# Patient Record
Sex: Male | Born: 1965 | Race: Black or African American | Hispanic: No | Marital: Married | Smoking: Never smoker
Health system: Southern US, Community
[De-identification: ages and names within clinical notes are randomized; demographics above are authoritative.]

## PROBLEM LIST (undated history)

## (undated) DIAGNOSIS — I82409 Acute embolism and thrombosis of unspecified deep veins of unspecified lower extremity: Secondary | ICD-10-CM

## (undated) DIAGNOSIS — Z789 Other specified health status: Secondary | ICD-10-CM

## (undated) HISTORY — PX: NO PAST SURGERIES: SHX2092

---

## 2007-01-23 ENCOUNTER — Emergency Department (HOSPITAL_COMMUNITY): Admission: EM | Admit: 2007-01-23 | Discharge: 2007-01-23 | Payer: Self-pay | Admitting: Family Medicine

## 2007-01-27 ENCOUNTER — Emergency Department (HOSPITAL_COMMUNITY): Admission: EM | Admit: 2007-01-27 | Discharge: 2007-01-27 | Payer: Self-pay | Admitting: Emergency Medicine

## 2007-12-26 ENCOUNTER — Emergency Department (HOSPITAL_COMMUNITY): Admission: EM | Admit: 2007-12-26 | Discharge: 2007-12-26 | Payer: Self-pay | Admitting: Emergency Medicine

## 2009-11-15 ENCOUNTER — Encounter: Payer: Self-pay | Admitting: Physician Assistant

## 2009-12-13 ENCOUNTER — Ambulatory Visit: Payer: Self-pay | Admitting: Physician Assistant

## 2009-12-13 DIAGNOSIS — M25519 Pain in unspecified shoulder: Secondary | ICD-10-CM

## 2009-12-14 ENCOUNTER — Ambulatory Visit (HOSPITAL_COMMUNITY): Admission: RE | Admit: 2009-12-14 | Discharge: 2009-12-14 | Payer: Self-pay | Admitting: Physician Assistant

## 2009-12-17 ENCOUNTER — Telehealth: Payer: Self-pay | Admitting: Physician Assistant

## 2009-12-17 ENCOUNTER — Telehealth (INDEPENDENT_AMBULATORY_CARE_PROVIDER_SITE_OTHER): Payer: Self-pay | Admitting: *Deleted

## 2010-01-08 ENCOUNTER — Encounter: Admission: RE | Admit: 2010-01-08 | Discharge: 2010-02-13 | Payer: Self-pay | Admitting: Physician Assistant

## 2010-01-09 ENCOUNTER — Encounter: Payer: Self-pay | Admitting: Physician Assistant

## 2010-01-31 ENCOUNTER — Encounter: Payer: Self-pay | Admitting: Physician Assistant

## 2010-02-21 ENCOUNTER — Telehealth: Payer: Self-pay | Admitting: Physician Assistant

## 2010-02-21 ENCOUNTER — Ambulatory Visit: Payer: Self-pay | Admitting: Physician Assistant

## 2010-02-21 DIAGNOSIS — R82998 Other abnormal findings in urine: Secondary | ICD-10-CM | POA: Insufficient documentation

## 2010-02-21 DIAGNOSIS — H6120 Impacted cerumen, unspecified ear: Secondary | ICD-10-CM

## 2010-02-21 LAB — CONVERTED CEMR LAB
Alkaline Phosphatase: 69 units/L (ref 39–117)
Amphetamine Screen, Ur: NEGATIVE
Barbiturate Quant, Ur: NEGATIVE
Basophils Relative: 1 % (ref 0–1)
CO2: 23 meq/L (ref 19–32)
Calcium: 9.5 mg/dL (ref 8.4–10.5)
Chloride: 103 meq/L (ref 96–112)
Creatinine,U: 208 mg/dL
Glucose, Urine, Semiquant: NEGATIVE
Hemoglobin: 13.6 g/dL (ref 13.0–17.0)
MCHC: 31.6 g/dL (ref 30.0–36.0)
MCV: 81.5 fL (ref 78.0–100.0)
Methadone: NEGATIVE
Monocytes Absolute: 0.7 10*3/uL (ref 0.1–1.0)
Neutro Abs: 4.5 10*3/uL (ref 1.7–7.7)
PSA: 1.14 ng/mL (ref 0.10–4.00)
Phencyclidine (PCP): NEGATIVE
Platelets: 317 10*3/uL (ref 150–400)
Potassium: 4.6 meq/L (ref 3.5–5.3)
Protein, U semiquant: NEGATIVE
Sodium: 139 meq/L (ref 135–145)
Specific Gravity, Urine: >=1.03
TSH: 0.97 microintl units/mL (ref 0.350–4.500)
Total Bilirubin: 0.3 mg/dL (ref 0.3–1.2)
Urobilinogen, UA: 0.2
Vit D, 25-Hydroxy: 32 ng/mL (ref 30–89)
WBC Urine, dipstick: NEGATIVE

## 2010-02-22 ENCOUNTER — Encounter: Payer: Self-pay | Admitting: Physician Assistant

## 2010-02-26 ENCOUNTER — Ambulatory Visit: Payer: Self-pay | Admitting: Physician Assistant

## 2010-02-27 ENCOUNTER — Encounter: Payer: Self-pay | Admitting: Physician Assistant

## 2010-02-27 LAB — CONVERTED CEMR LAB
HDL: 52 mg/dL (ref >39)
LDL Cholesterol: 102 mg/dL — ABNORMAL HIGH (ref 0–99)
Total CHOL/HDL Ratio: 3.5
Triglycerides: 142 mg/dL (ref <150)
VLDL: 28 mg/dL (ref 0–40)

## 2010-10-15 NOTE — Letter (Signed)
Summary: *HSN Results Follow up  [facility]  [address] [place], [place] [address]   Phone: [phone]  Fax: [phone]      02/22/2010   [facility] [address] [place], [place]  [address]   Dear  Mr. Raymond Lindsey,                            ____S.Drinkard,FNP   ____D. Gore,FNP       ____B. McPherson,MD   ____V. Rankins,MD    ____E. Mulberry,MD    ____N. Daphine Deutscher, FNP  ____D. Reche Dixon, MD    ____K. Philipp Deputy, MD    __x__S. Alben Spittle, PA-C     This letter is to inform you that your recent test(s):  _______Pap Smear    ___x____Lab Test     _______X-ray    ___x____ is within acceptable limits  _______ requires a medication change  _______ requires a follow-up lab visit  _______ requires a follow-up visit with your provider   Comments: Liver function, kidney function, blood counts, prostate test, thyroid all looked normal.       _________________________________________________________ If you have any questions, please contact our office                     Sincerely,  Raymond Newcomer PA-C [facility]

## 2010-10-15 NOTE — Progress Notes (Signed)
Summary: TEST RESULTS  Phone Note Call from Patient Call back at Home Phone [phone]   Reason for Call: Lab or Test Results, Referral Summary of Call: Kristyanna Barcelo PT. MR Buddenhagen CALLED AND WOULD LIKE FOR SOMEONE TO CALL HIM WITH THE X-RAY RESULTS AND HE IS CHECKING ON HIS PT REFERRAL. Initial call taken by: Leodis Rains,  December 17, 2009 12:26 PM  Follow-up for Phone Call        forward to provider to review and provider signs off we will contact pt.. Follow-up by: Mikey College CMA,  December 17, 2009 5:01 PM  Additional Follow-up for Phone Call Additional follow up Details #1::        He has a bone spur which may irritate the rotator cuff.  Please check on his referral to PT. Additional Follow-up by: Tereso Newcomer PA-C,  December 17, 2009 5:37 PM    Additional Follow-up for Phone Call Additional follow up Details #2::    Left message on answering machine for pt to call back.Marland KitchenMarland KitchenMarland KitchenArmenia Shannon  December 17, 2009 6:07 PM  Left message on answering machine for pt to call back.Marland KitchenMarland KitchenMarland KitchenArmenia Shannon  December 18, 2009 10:06 AM  spoke with pt and he would like to know if there is something he needs to do while he waits on his PT referral.[facility]  December 18, 2009 3:05 PM   Additional Follow-up for Phone Call Additional follow up Details #3:: Details for Additional Follow-up Action Taken: He can take the naprosyn as needed. He can keep his arm moving (reaching over his head for range of motion) so his shoulder does not get stiff. 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  So, I expect PT will be helpful. If not, we may need to get an MRI and have him see ortho.  spoke with patient and he is aware of what he will need to do until PT referral is ready.Marland KitchenMarland KitchenMikey College CMA  December 21, 2009 10:20 AM  Additional Follow-up by: Tereso Newcomer PA-C,  December 19, 2009 9:33 PM

## 2010-10-15 NOTE — Miscellaneous (Signed)
Summary: Rehab Report/DISCHARGE SUMMARY  Rehab Report/DISCHARGE SUMMARY   Imported By: Arta Bruce 02/25/2010 09:15:56  _____________________________________________________________________  External Attachment:    Type:   Image     Comment:   External Document

## 2010-10-15 NOTE — Assessment & Plan Note (Signed)
Summary: PHSYSICAL EXAM//[facility]   Vital Signs:  Patient profile:   45 year old male Height:      66 inches Weight:      239 pounds BMI:     38.72 Temp:     97.4 degrees F oral Pulse rate:   77 / minute Pulse rhythm:   regular Resp:     18 per minute BP sitting:   122 / 78  (left arm) Cuff size:   large  Vitals Entered By: Armenia Shannon (February 21, 2010 2:23 PM) CC: cpe.... Is Patient Diabetic? No Pain Assessment Patient in pain? no       Does patient need assistance? Functional Status Self care Ambulation Normal   Primary Care Provider:  Tereso Newcomer PA-C  CC:  cpe.....  History of Present Illness: Here for CPE.  Health maint: Need lipids checked. PHQ9=0. No Fhx prostate cancer. No FHx of colon cancer. No urinary complaints. No complaints today.  Habits & Providers  Alcohol-Tobacco-Diet     Alcohol drinks/day: 0     Tobacco Status: quit  Exercise-Depression-Behavior     Have you felt down or hopeless? no     Have you felt little pleasure in things? no     STD Risk: never     Drug Use: never     Seat Belt Use: always     Sun Exposure: frequent     Sun Exposure Counseling: wear sunscreen  Problems Prior to Update: 1)  Cerumen Impaction, Bilateral  (ICD-380.4) 2)  Urinalysis, Abnormal  (ICD-791.9) 3)  Family Planning  (ICD-V25.09) 4)  Preventive Health Care  (ICD-V70.0) 5)  Shoulder Pain, Right  (ICD-719.41) 6)  Family History Diabetes 1st Degree Relative  (ICD-V18.0) 7)  Family History Breast Cancer 1st Degree Relative <50  (ICD-V16.3)  Allergies (verified): 1)  ! Pcn  Past History:  Past Medical History: Last updated: 12/13/2009 h/o DVT Left Leg   a.  2006; took coumadin for 6 mos.   b.  unknown cause per patient; not clear that he had hypercoagulable workup  Past Surgical History: Last updated: 12/13/2009 Denies surgical history  Family History: Reviewed history from 12/13/2009 and no changes required. no FHx of blood clots Family  History Breast cancer 1st degree relative <50 - mom Family History Diabetes 1st degree relative - dad Family History Hypertension - mom no FHx of colon or prostate cancer  Social History: Reviewed history from 12/13/2009 and no changes required. 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 CV:  Denies chest pain or discomfort and shortness of breath with exertion. Resp:  Denies cough. GI:  Denies bloody stools and dark tarry stools. GU:  Denies dysuria, hematuria, and nocturia. MS:  right shoulder pain much better after PT . Derm:  Denies rash. Psych:  Denies suicidal thoughts/plans. Heme:  Denies bleeding.  Physical Exam  General:  alert, well-developed, and well-nourished.   Head:  normocephalic and atraumatic.   Eyes:  pupils equal, pupils round, and pupils reactive to light.   Ears:  R cerumen impaction and L Cerumen impaction.   Nose:  no external deformity.   Mouth:  pharynx pink and moist and poor dentition.   Neck:  supple, no thyromegaly, and no  cervical lymphadenopathy.   Chest Wall:  no deformities.   Lungs:  normal breath sounds, no crackles, and no wheezes.   Heart:  normal rate, regular rhythm, and no murmur.   Abdomen:  soft, non-tender, and normal bowel sounds.   Rectal:  no external abnormalities, no hemorrhoids, normal sphincter tone, no masses, no tenderness, no fissures, no fistulae, and no perianal rash.   Genitalia:  circumcised, no hydrocele, no varicocele, no scrotal masses, no testicular masses or atrophy, no cutaneous lesions, and no urethral discharge.   Prostate:  no gland enlargement, no nodules, no asymmetry, and no induration.   Msk:  normal ROM.   Pulses:  R posterior tibial normal, R dorsalis pedis  normal, L posterior tibial normal, and L dorsalis pedis normal.   Extremities:  no edema Neurologic:  alert & oriented X3, cranial nerves II-XII intact, strength normal in all extremities, and DTRs symmetrical and normal.   Skin:  turgor normal.   Inguinal Nodes:  no R inguinal adenopathy and no L inguinal adenopathy.   Psych:  normally interactive and good eye contact.     Impression & Recommendations:  Problem # 1:  PREVENTIVE HEALTH CARE (ICD-V70.0) discussed PSA and he wants to get bring back for lipids  Orders: T-Comprehensive Metabolic Panel [phone]) T-CBC w/Diff [phone]) T-HIV Antibody  (Reflex) [phone]) T-TSH [id number]) T-Syphilis Test (RPR) [phone]) T-Urinalysis ([id number]) T-Drug Screen-Urine, (single) [phone]) T-Vitamin D (25-Hydroxy) ([id number]) T-PSA (60109-32355)  Problem # 2:  SHOULDER PAIN, RIGHT (ICD-719.41) Assessment: Improved f/u as needed  His updated medication list for this problem includes:    Naprosyn 500 Mg Tabs (Naproxen) .Marland Kitchen... Take 1 tablet by mouth two times a day for 5-7 days, then two times a day as needed for pain  Problem # 3:  FAMILY PLANNING (ICD-V25.09)  wants referral to urology to get vasectomy  Orders: Urology Referral (Urology)  Problem # 4:  CERUMEN IMPACTION, BILATERAL (ICD-380.4) ear flush today  Complete Medication List: 1)  Amoxicillin 500 Mg Caps (Amoxicillin) .... One cap three times a day 2)  Naprosyn 500 Mg Tabs (Naproxen) .... Take 1 tablet by mouth two times a day for 5-7 days, then two times a day as needed for pain  Other Orders: T- * Misc. Laboratory test [phone])  Patient Instructions: 1)  Schedule fasting Lipids (Dx V70.0). 2)  Bilateral ear flush in office today. 3)  Please schedule a follow-up appointment in 1-2 years with Lorin Picket for CPE or sooner as needed. 4)  Someone will call you about the referral to urology.   Laboratory Results   Urine Tests    Routine Urinalysis    Glucose: negative   (Normal Range: Negative) Bilirubin: negative   (Normal Range: Negative) Ketone: negative   (Normal Range: Negative) Spec. Gravity: >=1.030   (Normal Range: 1.003-1.035) Blood: trace-intact   (Normal Range: Negative) pH: 5.5   (Normal Range: 5.0-8.0) Protein: negative   (Normal Range: Negative) Urobilinogen: 0.2   (Normal Range: 0-1) Nitrite: negative   (Normal Range: Negative) Leukocyte Esterace: negative   (Normal Range: Negative)

## 2010-10-15 NOTE — Miscellaneous (Signed)
Summary: INITIAL SUMMARY  INITIAL SUMMARY   Imported By: Arta Bruce 01/14/2010 09:13:13  _____________________________________________________________________  External Attachment:    Type:   Image     Comment:   External Document

## 2010-10-15 NOTE — Letter (Signed)
Summary: PT INFORMATION SHEET  PT INFORMATION SHEET   Imported By: Arta Bruce 02/14/2010 15:43:16  _____________________________________________________________________  External Attachment:    Type:   Image     Comment:   External Document

## 2010-10-15 NOTE — Progress Notes (Signed)
Summary: Office Visit//DEPRESSION SCREENING  Office Visit//DEPRESSION SCREENING   Imported By: Arta Bruce 03/26/2010 12:38:18  _____________________________________________________________________  External Attachment:    Type:   Image     Comment:   External Document

## 2010-10-15 NOTE — Progress Notes (Signed)
  Phone Note Call from Patient   Summary of Call: pt has a referral in system for PT Initial call taken by: Armenia Shannon,  December 17, 2009 6:08 PM

## 2010-10-15 NOTE — Progress Notes (Signed)
Summary: Urology referral  Phone Note Outgoing Call   Summary of Call: Refer to urology for vasectomy. Initial call taken by: Tereso Newcomer PA-C,  February 21, 2010 3:10 PM  Follow-up for Phone Call        Pt is sched @ [facility] on 03-11-10 @ 2:30pm with Dr.Mullins.The address is [address] # is [phone] Pt is aware that this is a consult appt during which time he will sign a consent form and will have to wait 30 days for the procedure to be performed Follow-up by: Candi Leash,  February 27, 2010 3:12 PM

## 2010-10-15 NOTE — Letter (Signed)
Summary: *HSN Results Follow up  [facility]  [address] [place], [place] [address]   Phone: [phone]  Fax: [phone]      02/27/2010   [facility] [address] [place], [place]  [address]   Dear  Mr. Raymond Lindsey,                            ____S.Drinkard,FNP   ____D. Gore,FNP       ____B. McPherson,MD   ____V. Rankins,MD    ____E. Mulberry,MD    ____N. Daphine Deutscher, FNP  ____D. Reche Dixon, MD    ____K. Philipp Deputy, MD    __x__S. Alben Spittle, PA-C     This letter is to inform you that your recent test(s):  _______Pap Smear    ___x____Lab Test     _______X-ray    ___x____ is within acceptable limits  _______ requires a medication change  _______ requires a follow-up lab visit  _______ requires a follow-up visit with your provider   Comments:  Cholesterol numbers are good.       _________________________________________________________ If you have any questions, please contact our office                     Sincerely,  Raymond Newcomer PA-C [facility]

## 2010-10-15 NOTE — Assessment & Plan Note (Signed)
Summary: NEW MEDICAID PT/ FIRST EST CARE/ [facility]   Vital Signs:  Patient profile:   45 year old male Height:      66 inches Weight:      244 pounds BMI:     39.52 Temp:     97.9 degrees F oral Pulse rate:   68 / minute Pulse rhythm:   regular Resp:     18 per minute BP sitting:   135 / 75  (left arm) Cuff size:   large  Vitals Entered By: Arm[place] Shannon (December 13, 2009 10:45 AM) CC: wants to get CPE..;. pt is concerned about his right shoulder pain for a couple of months... Is Patient Diabetic? No Pain Assessment Patient in pain? yes     Location: right shoulder Intensity: 7 Type: sharp Onset of pain  Gradual  Does patient need assistance? Functional Status Self care Ambulation Normal   Primary Care Provider:  Tereso Newcomer PA-C  CC:  wants to get CPE..;. pt is concerned about his right shoulder pain for a couple of months....  History of Present Illness: New patient. No regular healthcare.  Used to go to urgent care. Had root canal recently and is on amox. now.  R shoulder pain:  Pain x 1 month.  Pain on outside over deltoid.  Comes and goes.  Notes with outstretched arm over head.  Notes pain at about 90 degrees of abduction.  NO bruising or swelling.  No h/o injury or surgery.  Takes tylenol at times with some relief.  Seems to be worse early in morning.  Feels with certain movements during the day.  Habits & Providers  Alcohol-Tobacco-Diet     Tobacco Status: never  Exercise-Depression-Behavior     Drug Use: no  Current Medications (verified): 1)  Amoxicillin 500 Mg Caps (Amoxicillin) .... One Cap Three Times A Day  Allergies (verified): 1)  ! Pcn  Past History:  Past Medical History: h/o DVT Left Leg   a.  2006; took coumadin for 6 mos.   b.  unknown cause per patient; not clear that he had hypercoagulable workup  Past Surgical History: Denies surgical history  Family History: no FHx of blood clots Family History Breast cancer 1st degree relative  <50 - mom Family History Diabetes 1st degree relative - dad Family History Hypertension - mom no FHx of colon or prostate cancer  Social History: Occupation: unemployed (Worked for [employer]) Married 2 kids Never Smoked Alcohol use-no Drug use-no Occupation:  employed Smoking Status:  never Drug Use:  no  Review of Systems      See HPI General:  Denies chills and fever. CV:  Denies chest pain or discomfort. Resp:  Denies cough. GI:  Denies bloody stools. GU:  Denies dysuria.  Physical Exam  General:  alert, well-developed, and well-nourished.   Head:  normocephalic and atraumatic.   Neck:  supple.   Lungs:  normal breath sounds.   Heart:  normal rate and regular rhythm.   Msk:  right shoulder: limited internal rotation due to pain + pain with empty can test no deformity + pain over AC joint and suprspinatus insertion site no pain over biceps tendon no coracoclavicular pain  Extremities:  no edema  Neurologic:  alert & oriented X3 and cranial nerves II-XII intact.   Psych:  normally interactive.     Impression & Recommendations:  Problem # 1:  SHOULDER PAIN, RIGHT (ICD-719.41)  suspect impingement  Rx for naproxen get xray send to PT  f/u at CPE  His updated medication list for this problem includes:    Naprosyn 500 Mg Tabs (Naproxen) .Marland Kitchen... Take 1 tablet by mouth two times a day for 5-7 days, then two times a day as needed for pain  Orders: Diagnostic X-Ray/Fluoroscopy (Diagnostic X-Ray/Flu) Physical Therapy Referral (PT)  Problem # 2:  Preventive Health Care (ICD-V70.0) schedule CPE  Complete Medication List: 1)  Amoxicillin 500 Mg Caps (Amoxicillin) .... One cap three times a day 2)  Naprosyn 500 Mg Tabs (Naproxen) .... 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 2)  Please schedule a follow-up appointment in 2 months for CPE. 3)   Come in to next appt fasting.  Do not eat or drink after midnight except water. 4)  Take naprosyn two times a day for 5-7 days, then take two times a day as needed for pain. 5)  Someone will call you about your appt with physical therapy. Prescriptions: NAPROSYN 500 MG TABS (NAPROXEN) Take 1 tablet by mouth two times a day for 5-7 days, then two times a day as needed for pain  #60 x 1   Entered and Authorized by:   Tereso Newcomer PA-C   Signed by:   Tereso Newcomer PA-C on 12/13/2009   Method used:   Print then Give to Patient   RxID:   [id number]

## 2012-02-12 ENCOUNTER — Encounter (HOSPITAL_BASED_OUTPATIENT_CLINIC_OR_DEPARTMENT_OTHER): Payer: Self-pay | Admitting: *Deleted

## 2012-02-12 NOTE — Progress Notes (Signed)
No labs needed Rare use of albuterol inhaler

## 2012-02-16 ENCOUNTER — Ambulatory Visit (HOSPITAL_BASED_OUTPATIENT_CLINIC_OR_DEPARTMENT_OTHER)
Admission: RE | Admit: 2012-02-16 | Discharge: 2012-02-16 | Disposition: A | Discharge status: Home / Self Care | Payer: Medicaid Other | Source: Ambulatory Visit | Attending: Specialist | Admitting: Specialist

## 2012-02-16 ENCOUNTER — Encounter (HOSPITAL_BASED_OUTPATIENT_CLINIC_OR_DEPARTMENT_OTHER)
Admission: RE | Disposition: A | Discharge status: Home / Self Care | Payer: Self-pay | Source: Ambulatory Visit | Attending: Specialist

## 2012-02-16 ENCOUNTER — Encounter (HOSPITAL_BASED_OUTPATIENT_CLINIC_OR_DEPARTMENT_OTHER): Payer: Self-pay

## 2012-02-16 ENCOUNTER — Encounter (HOSPITAL_BASED_OUTPATIENT_CLINIC_OR_DEPARTMENT_OTHER): Payer: Self-pay | Admitting: Anesthesiology

## 2012-02-16 ENCOUNTER — Ambulatory Visit (HOSPITAL_BASED_OUTPATIENT_CLINIC_OR_DEPARTMENT_OTHER): Payer: Medicaid Other | Admitting: Anesthesiology

## 2012-02-16 DIAGNOSIS — D1739 Benign lipomatous neoplasm of skin and subcutaneous tissue of other sites: Secondary | ICD-10-CM | POA: Insufficient documentation

## 2012-02-16 DIAGNOSIS — J45909 Unspecified asthma, uncomplicated: Secondary | ICD-10-CM | POA: Insufficient documentation

## 2012-02-16 HISTORY — DX: Other specified health status: Z78.9

## 2012-02-16 HISTORY — PX: MASS EXCISION: SHX2000

## 2012-02-16 SURGERY — EXCISION MASS
Anesthesia: General | Site: Neck | Laterality: Left | Wound class: Clean

## 2012-02-16 MED ORDER — PROPOFOL 10 MG/ML IV EMUL
INTRAVENOUS | Status: DC | PRN
  Administered 2012-02-16: 200 mg via INTRAVENOUS

## 2012-02-16 MED ORDER — ONDANSETRON HCL 4 MG/2ML IJ SOLN
INTRAMUSCULAR | Status: DC | PRN
  Administered 2012-02-16: 4 mg via INTRAVENOUS

## 2012-02-16 MED ORDER — CEFAZOLIN SODIUM 1-5 GM-% IV SOLN
1.0000 g | INTRAVENOUS | Status: AC
  Administered 2012-02-16: 2 g via INTRAVENOUS

## 2012-02-16 MED ORDER — MIDAZOLAM HCL 5 MG/5ML IJ SOLN
INTRAMUSCULAR | Status: DC | PRN
  Administered 2012-02-16: 2 mg via INTRAVENOUS

## 2012-02-16 MED ORDER — LACTATED RINGERS IV SOLN
INTRAVENOUS | Status: DC
  Administered 2012-02-16 (×2): via INTRAVENOUS

## 2012-02-16 MED ORDER — LIDOCAINE-EPINEPHRINE 0.5-1:200000 % IJ SOLN
INTRAMUSCULAR | Status: DC | PRN
  Administered 2012-02-16: 30 mL

## 2012-02-16 MED ORDER — FENTANYL CITRATE 0.05 MG/ML IJ SOLN
INTRAMUSCULAR | Status: DC | PRN
  Administered 2012-02-16 (×2): 50 ug via INTRAVENOUS

## 2012-02-16 MED ORDER — LIDOCAINE HCL (CARDIAC) 20 MG/ML IV SOLN
INTRAVENOUS | Status: DC | PRN
  Administered 2012-02-16: 60 mg via INTRAVENOUS

## 2012-02-16 MED ORDER — IPRATROPIUM-ALBUTEROL 18-103 MCG/ACT IN AERO
INHALATION_SPRAY | RESPIRATORY_TRACT | Status: DC | PRN
  Administered 2012-02-16: 2 via RESPIRATORY_TRACT

## 2012-02-16 MED ORDER — DEXAMETHASONE SODIUM PHOSPHATE 4 MG/ML IJ SOLN
INTRAMUSCULAR | Status: DC | PRN
  Administered 2012-02-16: 10 mg via INTRAVENOUS

## 2012-02-16 SURGICAL SUPPLY — 60 items
BAG DECANTER FOR FLEXI CONT (MISCELLANEOUS) ×2 IMPLANT
BANDAGE ELASTIC 4 VELCRO ST LF (GAUZE/BANDAGES/DRESSINGS) IMPLANT
BANDAGE GAUZE ELAST BULKY 4 IN (GAUZE/BANDAGES/DRESSINGS) IMPLANT
BENZOIN TINCTURE PRP APPL 2/3 (GAUZE/BANDAGES/DRESSINGS) IMPLANT
BLADE KNIFE PERSONA 10 (BLADE) ×2 IMPLANT
BLADE KNIFE PERSONA 15 (BLADE) ×2 IMPLANT
BNDG COHESIVE 4X5 TAN STRL (GAUZE/BANDAGES/DRESSINGS) IMPLANT
CANISTER SUCTION 1200CC (MISCELLANEOUS) IMPLANT
CLEANER CAUTERY TIP 5X5 PAD (MISCELLANEOUS) IMPLANT
CLOTH BEACON ORANGE TIMEOUT ST (SAFETY) ×2 IMPLANT
COTTONBALL LRG STERILE PKG (GAUZE/BANDAGES/DRESSINGS) IMPLANT
COVER MAYO STAND STRL (DRAPES) ×2 IMPLANT
COVER TABLE BACK 60X90 (DRAPES) ×2 IMPLANT
DRAPE LAPAROSCOPIC ABDOMINAL (DRAPES) IMPLANT
DRAPE PED LAPAROTOMY (DRAPES) IMPLANT
DRAPE U-SHAPE 76X120 STRL (DRAPES) ×2 IMPLANT
DRSG PAD ABDOMINAL 8X10 ST (GAUZE/BANDAGES/DRESSINGS) IMPLANT
ELECT NEEDLE TIP 2.8 STRL (NEEDLE) ×2 IMPLANT
ELECT REM PT RETURN 9FT ADLT (ELECTROSURGICAL) ×2
ELECTRODE REM PT RTRN 9FT ADLT (ELECTROSURGICAL) ×1 IMPLANT
FILTER 7/8 IN (FILTER) IMPLANT
GAUZE SPONGE 4X4 12PLY STRL LF (GAUZE/BANDAGES/DRESSINGS) IMPLANT
GAUZE SPONGE 4X4 16PLY XRAY LF (GAUZE/BANDAGES/DRESSINGS) IMPLANT
GAUZE XEROFORM 1X8 LF (GAUZE/BANDAGES/DRESSINGS) ×2 IMPLANT
GAUZE XEROFORM 5X9 LF (GAUZE/BANDAGES/DRESSINGS) IMPLANT
GLOVE BIO SURGEON STRL SZ 6.5 (GLOVE) ×4 IMPLANT
GLOVE BIOGEL M STRL SZ7.5 (GLOVE) ×2 IMPLANT
GLOVE BIOGEL PI IND STRL 8 (GLOVE) ×1 IMPLANT
GLOVE BIOGEL PI INDICATOR 8 (GLOVE) ×1
GLOVE ECLIPSE 7.0 STRL STRAW (GLOVE) ×2 IMPLANT
GOWN PREVENTION PLUS XLARGE (GOWN DISPOSABLE) IMPLANT
GOWN PREVENTION PLUS XXLARGE (GOWN DISPOSABLE) ×2 IMPLANT
NEEDLE HYPO 25X1 1.5 SAFETY (NEEDLE) ×2 IMPLANT
PACK BASIN DAY SURGERY FS (CUSTOM PROCEDURE TRAY) ×2 IMPLANT
PAD CLEANER CAUTERY TIP 5X5 (MISCELLANEOUS)
PENCIL BUTTON HOLSTER BLD 10FT (ELECTRODE) ×2 IMPLANT
SHEET MEDIUM DRAPE 40X70 STRL (DRAPES) ×2 IMPLANT
SHEETING SILICONE GEL EPI DERM (MISCELLANEOUS) IMPLANT
SPONGE GAUZE 4X4 12PLY (GAUZE/BANDAGES/DRESSINGS) IMPLANT
SPONGE LAP 18X18 X RAY DECT (DISPOSABLE) ×2 IMPLANT
STAPLER VISISTAT 35W (STAPLE) IMPLANT
STOCKINETTE 4X48 STRL (DRAPES) IMPLANT
STRIP CLOSURE SKIN 1/2X4 (GAUZE/BANDAGES/DRESSINGS) IMPLANT
STRIP SUTURE WOUND CLOSURE 1/2 (SUTURE) IMPLANT
SUCTION FRAZIER TIP 10 FR DISP (SUCTIONS) IMPLANT
SUT MNCRL AB 3-0 PS2 18 (SUTURE) IMPLANT
SUT PROLENE 4 0 P 3 18 (SUTURE) IMPLANT
SUT PROLENE 4 0 PS 2 18 (SUTURE) ×2 IMPLANT
SUT SILK 3 0 PS 1 (SUTURE) IMPLANT
SYR 20CC LL (SYRINGE) ×4 IMPLANT
SYR CONTROL 10ML LL (SYRINGE) ×2 IMPLANT
TAPE HYPAFIX 6X30 (GAUZE/BANDAGES/DRESSINGS) IMPLANT
TOWEL OR 17X24 6PK STRL BLUE (TOWEL DISPOSABLE) ×4 IMPLANT
TRAY DSU PREP LF (CUSTOM PROCEDURE TRAY) ×2 IMPLANT
TUBE CONNECTING 20X1/4 (TUBING) IMPLANT
TUBING SET GRADUATE ASPIR 12FT (MISCELLANEOUS) ×2 IMPLANT
UNDERPAD 30X30 INCONTINENT (UNDERPADS AND DIAPERS) ×2 IMPLANT
VAC PENCILS W/TUBING CLEAR (MISCELLANEOUS) ×2 IMPLANT
WATER STERILE IRR 1000ML POUR (IV SOLUTION) ×2 IMPLANT
YANKAUER SUCT BULB TIP NO VENT (SUCTIONS) ×2 IMPLANT

## 2012-02-16 NOTE — H&P (Signed)
Pascual Greenley is an 46 y.o. male.   Chief Complaint: Mass left back HPI: Increased growth and pain  Past Medical History  Diagnosis Date  . No pertinent past medical history   . Asthma     mild-mostly exercise induced    Past Surgical History  Procedure Date  . No past surgeries     never been sedated    History reviewed. No pertinent family history. Social History:  reports that he has never smoked. He does not have any smokeless tobacco history on file. He reports that he does not drink alcohol or use illicit drugs.  Allergies:  Allergies  Allergen Reactions  . Penicillins     Medications Prior to Admission  Medication Sig Dispense Refill  . albuterol (PROVENTIL HFA;VENTOLIN HFA) 108 (90 BASE) MCG/ACT inhaler Inhale 2 puffs into the lungs every 6 (six) hours as needed.      . Multiple Vitamins-Minerals (MULTIVITAMIN WITH MINERALS) tablet Take 1 tablet by mouth daily.        No results found for this or any previous visit (from the past 48 hour(s)). No results found.  Review of Systems  Constitutional: Negative.   HENT: Negative.   Eyes: Negative.   Cardiovascular: Negative.   Gastrointestinal: Negative.   Genitourinary: Negative.   Musculoskeletal: Negative.   Skin: Negative.   Neurological: Negative.   Endo/Heme/Allergies: Negative.   Psychiatric/Behavioral: Negative.     Blood pressure 132/79, pulse 75, temperature 98.6 F (37 C), temperature source Oral, resp. rate 20, height 5\' 10"  (1.778 m), weight 99.791 kg (220 lb), SpO2 100.00%. Physical Exam   Assessment/Plan Mass left back with excision and plastic closure  Fatim Vanderschaaf L 02/16/2012, 7:13 AM

## 2012-02-16 NOTE — Brief Op Note (Signed)
02/16/2012  8:10 AM  PATIENT:  Raymond Lindsey  46 y.o. male  PRE-OPERATIVE DIAGNOSIS:  mass left neck  POST-OPERATIVE DIAGNOSIS:  mass left neck  PROCEDURE:  Procedure(s) (LRB): EXCISION MASS (Left) and plastic closure  SURGEON:  Surgeon(s) and Role:    * Louisa Second, MD - Primary  PHYSICIAN ASSISTANT:   ASSISTANTS: none   ANESTHESIA:   general  EBL:     BLOOD ADMINISTERED:none  DRAINS: none   LOCAL MEDICATIONS USED:  LIDOCAINE   SPECIMEN:  Excision  DISPOSITION OF SPECIMEN:  PATHOLOGY  COUNTS:  YES  TOURNIQUET:  * No tourniquets in log *  DICTATION: .Other Dictation: Dictation Number [id number]  PLAN OF CARE: Discharge to home after PACU  PATIENT DISPOSITION:  PACU - hemodynamically stable.   Delay start of Pharmacological VTE agent (>24hrs) due to surgical blood loss or risk of bleeding: not applicable

## 2012-02-16 NOTE — Anesthesia Preprocedure Evaluation (Addendum)
Anesthesia Evaluation  Patient identified by MRN, date of birth, ID band Patient awake    Reviewed: Allergy & Precautions, H&P , NPO status , Patient's Chart, lab work & pertinent test results  Airway Mallampati: I TM Distance: >3 FB Neck ROM: Full    Dental  (+) Partial Lower and Partial Upper   Pulmonary  breath sounds clear to auscultation        Cardiovascular Rhythm:Regular Rate:Normal     Neuro/Psych    GI/Hepatic   Endo/Other    Renal/GU      Musculoskeletal   Abdominal   Peds  Hematology   Anesthesia Other Findings   Reproductive/Obstetrics                          Anesthesia Physical Anesthesia Plan  ASA: II  Anesthesia Plan: General   Post-op Pain Management:    Induction: Intravenous  Airway Management Planned: Oral ETT  Additional Equipment:   Intra-op Plan:   Post-operative Plan: Extubation in OR  Informed Consent: I have reviewed the patients History and Physical, chart, labs and discussed the procedure including the risks, benefits and alternatives for the proposed anesthesia with the patient or authorized representative who has indicated his/her understanding and acceptance.   Dental advisory given  Plan Discussed with:   Anesthesia Plan Comments: (Mild asthma Left lateral neck mass  Plan GA with oral ETT  Kipp Brood, MD)        Anesthesia Quick Evaluation

## 2012-02-16 NOTE — Anesthesia Postprocedure Evaluation (Signed)
  Anesthesia Post-op Note  Patient: Raymond Lindsey  Procedure(s) Performed: Procedure(s) (LRB): EXCISION MASS (Left)  Patient Location: PACU  Anesthesia Type: General  Level of Consciousness: awake, alert  and oriented  Airway and Oxygen Therapy: Patient Spontanous Breathing  Post-op Pain: mild  Post-op Assessment: Post-op Vital signs reviewed and Patient's Cardiovascular Status Stable  Post-op Vital Signs: stable  Complications: No apparent anesthesia complications

## 2012-02-16 NOTE — Anesthesia Procedure Notes (Signed)
Procedure Name: Intubation Performed by: York Grice Pre-anesthesia Checklist: Patient identified, Emergency Drugs available, Suction available, Patient being monitored and Timeout performed Patient Re-evaluated:Patient Re-evaluated prior to inductionOxygen Delivery Method: Circle system utilized Preoxygenation: Pre-oxygenation with 100% oxygen Intubation Type: IV induction Ventilation: Mask ventilation without difficulty Laryngoscope Size: Miller and 2 Grade View: Grade I Tube type: Oral Tube size: 8.0 mm Number of attempts: 1 Airway Equipment and Method: Stylet Placement Confirmation: ETT inserted through vocal cords under direct vision,  breath sounds checked- equal and bilateral and positive ETCO2 Secured at: 23 cm Tube secured with: Tape Dental Injury: Teeth and Oropharynx as per pre-operative assessment

## 2012-02-16 NOTE — Discharge Instructions (Signed)
Keep dressings clean and dry May remove after 48 hrs  Take meds as prescribed   Post Anesthesia Home Care Instructions  Activity: Get plenty of rest for the remainder of the day. A responsible adult should stay with you for 24 hours following the procedure.  For the next 24 hours, DO NOT: -Drive a car -Advertising copywriter -Drink alcoholic beverages -Take any medication unless instructed by your physician -Make any legal decisions or sign important papers.  Meals: Start with liquid foods such as gelatin or soup. Progress to regular foods as tolerated. Avoid greasy, spicy, heavy foods. If nausea and/or vomiting occur, drink only clear liquids until the nausea and/or vomiting subsides. Call your physician if vomiting continues.  Special Instructions/Symptoms: Your throat may feel dry or sore from the anesthesia or the breathing tube placed in your throat during surgery. If this causes discomfort, gargle with warm salt water. The discomfort should disappear within 24 hours.

## 2012-02-16 NOTE — Transfer of Care (Signed)
Immediate Anesthesia Transfer of Care Note  Patient: Raymond Lindsey  Procedure(s) Performed: Procedure(s) (LRB): EXCISION MASS (Left)  Patient Location: PACU  Anesthesia Type: General  Level of Consciousness: awake, alert  and oriented  Airway & Oxygen Therapy: Patient Spontanous Breathing and Patient connected to face mask oxygen  Post-op Assessment: Report given to PACU RN and Post -op Vital signs reviewed and stable  Post vital signs: Reviewed and stable  Complications: No apparent anesthesia complications

## 2012-02-17 NOTE — Op Note (Signed)
Raymond Lindsey, Raymond Lindsey               ACCOUNT NO.:  [id number]  MEDICAL RECORD NO.:  [id number]  LOCATION:                                 FACILITY:  PHYSICIAN:  Earvin Hansen L. Shon Hough, M.D.   DATE OF BIRTH:  DATE OF PROCEDURE:  02/16/2012 DATE OF DISCHARGE:                              OPERATIVE REPORT   This is a 46 year old gentleman who has enlarging mass involving his left posterior neck and back area junction, increased pain and discomfort, rule out lipoma, rule out liposarcoma.  ANESTHESIA:  General.  DESCRIPTION OF PROCEDURE:  The patient preoperatively underwent drawings for the area.  The area measured approximately 6 x 8 to 10 cm in length and width.  He underwent general anesthesia, intubated orally.  Prep was done to the left neck and back areas with Betadine soap and solution, walled off with sterile towels and drapes so as to make a sterile field. Xylocaine 0.25% with epinephrine was injected locally, total of 50 mL. This was allowed to set up and I was able to excise the mass, 15 blade, dissection with Metzenbaum scissors.  The residual portions peripherally were liposuctioned to smooth out the contour.  The mass transcended underneath the fascia which was open.  Hemostasis was maintained with the Bovie anticoagulation.  After proper hemostasis, the wounds were then closed in layers with advancement flap of 3-0 Monocryl x2 layers, a running subcuticular stitch of 3-0 Monocryl.  Steri-Strips and soft dressing were applied to all the areas.  The mass had the appearance of a lipoma, to be sent to Pathology for examination.  Sterile dressing were applied and Hypafix tape.  He withstood the procedures very well. He was taken to recovery in excellent condition.     Raymond Lindsey. Shon Hough, M.D.     Cathie Hoops  D:  02/16/2012  T:  02/16/2012  Job:  [id number]

## 2012-02-19 ENCOUNTER — Encounter (HOSPITAL_BASED_OUTPATIENT_CLINIC_OR_DEPARTMENT_OTHER): Payer: Self-pay | Admitting: Specialist

## 2013-10-21 ENCOUNTER — Emergency Department (HOSPITAL_COMMUNITY)
Admission: EM | Admit: 2013-10-21 | Discharge: 2013-10-21 | Disposition: A | Discharge status: Home / Self Care | Payer: BC Managed Care – PPO | Attending: Emergency Medicine | Admitting: Emergency Medicine

## 2013-10-21 ENCOUNTER — Encounter (HOSPITAL_COMMUNITY): Payer: Self-pay | Admitting: Emergency Medicine

## 2013-10-21 ENCOUNTER — Emergency Department (HOSPITAL_COMMUNITY): Payer: BC Managed Care – PPO

## 2013-10-21 DIAGNOSIS — R1110 Vomiting, unspecified: Secondary | ICD-10-CM

## 2013-10-21 DIAGNOSIS — Z880 Allergy status to penicillin: Secondary | ICD-10-CM | POA: Insufficient documentation

## 2013-10-21 DIAGNOSIS — Z79899 Other long term (current) drug therapy: Secondary | ICD-10-CM | POA: Insufficient documentation

## 2013-10-21 DIAGNOSIS — R12 Heartburn: Secondary | ICD-10-CM | POA: Insufficient documentation

## 2013-10-21 DIAGNOSIS — R55 Syncope and collapse: Secondary | ICD-10-CM | POA: Insufficient documentation

## 2013-10-21 DIAGNOSIS — R079 Chest pain, unspecified: Secondary | ICD-10-CM | POA: Insufficient documentation

## 2013-10-21 DIAGNOSIS — J45909 Unspecified asthma, uncomplicated: Secondary | ICD-10-CM | POA: Insufficient documentation

## 2013-10-21 DIAGNOSIS — R61 Generalized hyperhidrosis: Secondary | ICD-10-CM | POA: Insufficient documentation

## 2013-10-21 DIAGNOSIS — R112 Nausea with vomiting, unspecified: Secondary | ICD-10-CM | POA: Insufficient documentation

## 2013-10-21 LAB — COMPREHENSIVE METABOLIC PANEL
ALT: 19 U/L (ref 0–53)
AST: 21 U/L (ref 0–37)
Albumin: 3.7 g/dL (ref 3.5–5.2)
Alkaline Phosphatase: 69 U/L (ref 39–117)
BILIRUBIN TOTAL: 0.4 mg/dL (ref 0.3–1.2)
BUN: 13 mg/dL (ref 6–23)
CO2: 24 meq/L (ref 19–32)
Calcium: 8.6 mg/dL (ref 8.4–10.5)
Chloride: 107 mEq/L (ref 96–112)
Creatinine, Ser: 0.95 mg/dL (ref 0.50–1.35)
GFR calc non Af Amer: >90 mL/min (ref >90)
Glucose, Bld: 102 mg/dL — ABNORMAL HIGH (ref 70–99)
POTASSIUM: 4.3 meq/L (ref 3.7–5.3)
SODIUM: 142 meq/L (ref 137–147)
TOTAL PROTEIN: 7.4 g/dL (ref 6.0–8.3)

## 2013-10-21 LAB — CBC WITH DIFFERENTIAL/PLATELET
Basophils Absolute: 0 10*3/uL (ref 0.0–0.1)
Basophils Relative: 0 % (ref 0–1)
EOS ABS: 0 10*3/uL (ref 0.0–0.7)
Eosinophils Relative: 0 % (ref 0–5)
HCT: 42.9 % (ref 39.0–52.0)
HEMOGLOBIN: 14 g/dL (ref 13.0–17.0)
LYMPHS ABS: 0.4 10*3/uL — AB (ref 0.7–4.0)
LYMPHS PCT: 5 % — AB (ref 12–46)
MCH: 26 pg (ref 26.0–34.0)
MCHC: 32.6 g/dL (ref 30.0–36.0)
MCV: 79.7 fL (ref 78.0–100.0)
MONOS PCT: 4 % (ref 3–12)
Monocytes Absolute: 0.4 10*3/uL (ref 0.1–1.0)
NEUTROS ABS: 7.3 10*3/uL (ref 1.7–7.7)
Neutrophils Relative %: 90 % — ABNORMAL HIGH (ref 43–77)
PLATELETS: 253 10*3/uL (ref 150–400)
RBC: 5.38 MIL/uL (ref 4.22–5.81)
RDW: 13.2 % (ref 11.5–15.5)
WBC: 8.2 10*3/uL (ref 4.0–10.5)

## 2013-10-21 LAB — POCT I-STAT TROPONIN I
TROPONIN I, POC: 0.01 ng/mL (ref 0.00–0.08)
Troponin i, poc: 0 ng/mL (ref 0.00–0.08)

## 2013-10-21 LAB — LIPASE, BLOOD: LIPASE: 24 U/L (ref 11–59)

## 2013-10-21 MED ORDER — SODIUM CHLORIDE 0.9 % IV BOLUS (SEPSIS)
1000.0000 mL | Freq: Once | INTRAVENOUS | Status: AC
  Administered 2013-10-21: 1000 mL via INTRAVENOUS

## 2013-10-21 MED ORDER — ONDANSETRON HCL 4 MG/2ML IJ SOLN
4.0000 mg | Freq: Once | INTRAMUSCULAR | Status: AC
  Administered 2013-10-21: 4 mg via INTRAVENOUS
  Filled 2013-10-21: qty 2

## 2013-10-21 MED ORDER — OMEPRAZOLE 20 MG PO CPDR: 20.0000 mg | DELAYED_RELEASE_CAPSULE | Freq: Every day | ORAL | Status: AC

## 2013-10-21 MED ORDER — FAMOTIDINE IN NACL 20-0.9 MG/50ML-% IV SOLN
20.0000 mg | Freq: Once | INTRAVENOUS | Status: AC
  Administered 2013-10-21: 20 mg via INTRAVENOUS
  Filled 2013-10-21: qty 50

## 2013-10-21 MED ORDER — GI COCKTAIL ~~LOC~~
30.0000 mL | Freq: Once | ORAL | Status: AC
  Administered 2013-10-21: 30 mL via ORAL
  Filled 2013-10-21: qty 30

## 2013-10-21 NOTE — ED Provider Notes (Signed)
CSN: [id number]     Arrival date & time 10/21/13  0543 History   First MD Initiated Contact with Patient 10/21/13 0600     Chief Complaint  Patient presents with  . Chest Pain  . Loss of Consciousness   (Consider location/radiation/quality/duration/timing/severity/associated sxs/prior Treatment) HPI Comments: Patient presents to the ED with a chief complaint of a "burning sensation" in his chest.  He states that the symptoms started this morning around 0230.  He endorses associated nausea, vomiting, and diaphoresis.  He states that he was vomiting for about 2 hours, and states that he passed out while vomiting.  Wife called EMS.  Patient was given 324 of aspirin.  Patient states that he has never experienced anything like this before.  He denies any radiating symptoms.  No cardiac risk factors.  Patient does state that he has been taking a new workout supplement, but is uncertain as to what it is.  He also states that he had [place] sausage and onions last night.  Currently he is chest pain free, but does endorse heartburn.  The history is provided by the patient. No language interpreter was used.    Past Medical History  Diagnosis Date  . No pertinent past medical history   . Asthma     mild-mostly exercise induced   Past Surgical History  Procedure Laterality Date  . No past surgeries      never been sedated  . Mass excision  02/16/2012    Procedure: EXCISION MASS;  Surgeon: Cristine Polio, MD;  Location: [facility];  Service: Plastics;  Laterality: Left;  left neck   No family history on file. History  Substance Use Topics  . Smoking status: Never Smoker   . Smokeless tobacco: Not on file  . Alcohol Use: Yes    Review of Systems  All other systems reviewed and are negative.    Allergies  Penicillins  Home Medications   Current Outpatient Rx  Name  Route  Sig  Dispense  Refill  . albuterol (PROVENTIL HFA;VENTOLIN HFA) 108 (90 BASE) MCG/ACT inhaler  Inhalation   Inhale 2 puffs into the lungs every 6 (six) hours as needed.          BP 116/63  Pulse 89  Temp(Src) 98.7 F (37.1 C) (Oral)  Resp 15  Ht 5\' 10"  (1.778 m)  Wt 220 lb (99.791 kg)  BMI 31.57 kg/m2  SpO2 96% Physical Exam  Nursing note and vitals reviewed. Constitutional: He is oriented to person, place, and time. He appears well-developed and well-nourished.  HENT:  Head: Normocephalic and atraumatic.  Eyes: Conjunctivae and EOM are normal. Pupils are equal, round, and reactive to light. Right eye exhibits no discharge. Left eye exhibits no discharge. No scleral icterus.  Neck: Normal range of motion. Neck supple. No JVD present.  Cardiovascular: Normal rate, regular rhythm and normal heart sounds.  Exam reveals no gallop and no friction rub.   No murmur heard. Pulmonary/Chest: Effort normal and breath sounds normal. No respiratory distress. He has no wheezes. He has no rales. He exhibits no tenderness.  CTAB  Abdominal: Soft. He exhibits no distension and no mass. There is no tenderness. There is no rebound and no guarding.  No focal abdominal tenderness, no RLQ tenderness or pain at McBurney's point, no RUQ tenderness or Murphy's sign, no left-sided abdominal tenderness, no fluid wave, or signs of peritonitis   Musculoskeletal: Normal range of motion. He exhibits no edema and no tenderness.  Neurological:  He is alert and oriented to person, place, and time.  Skin: Skin is warm and dry.  Psychiatric: He has a normal mood and affect. His behavior is normal. Judgment and thought content normal.    ED Course  Procedures (including critical care time) Results for orders placed during the hospital encounter of 10/21/13  CBC WITH DIFFERENTIAL      Result Value Range   WBC 8.2  4.0 - 10.5 K/uL   RBC 5.38  4.22 - 5.81 MIL/uL   Hemoglobin 14.0  13.0 - 17.0 g/dL   HCT 42.9  39.0 - 52.0 %   MCV 79.7  78.0 - 100.0 fL   MCH 26.0  26.0 - 34.0 pg   MCHC 32.6  30.0 - 36.0  g/dL   RDW 13.2  11.5 - 15.5 %   Platelets 253  150 - 400 K/uL   Neutrophils Relative % 90 (*) 43 - 77 %   Neutro Abs 7.3  1.7 - 7.7 K/uL   Lymphocytes Relative 5 (*) 12 - 46 %   Lymphs Abs 0.4 (*) 0.7 - 4.0 K/uL   Monocytes Relative 4  3 - 12 %   Monocytes Absolute 0.4  0.1 - 1.0 K/uL   Eosinophils Relative 0  0 - 5 %   Eosinophils Absolute 0.0  0.0 - 0.7 K/uL   Basophils Relative 0  0 - 1 %   Basophils Absolute 0.0  0.0 - 0.1 K/uL  COMPREHENSIVE METABOLIC PANEL      Result Value Range   Sodium 142  137 - 147 mEq/L   Potassium 4.3  3.7 - 5.3 mEq/L   Chloride 107  96 - 112 mEq/L   CO2 24  19 - 32 mEq/L   Glucose, Bld 102 (*) 70 - 99 mg/dL   BUN 13  6 - 23 mg/dL   Creatinine, Ser 0.95  0.50 - 1.35 mg/dL   Calcium 8.6  8.4 - 10.5 mg/dL   Total Protein 7.4  6.0 - 8.3 g/dL   Albumin 3.7  3.5 - 5.2 g/dL   AST 21  0 - 37 U/L   ALT 19  0 - 53 U/L   Alkaline Phosphatase 69  39 - 117 U/L   Total Bilirubin 0.4  0.3 - 1.2 mg/dL   GFR calc non Af Amer >90  >90 mL/min   GFR calc Af Amer >90  >90 mL/min  LIPASE, BLOOD      Result Value Range   Lipase 24  11 - 59 U/L  POCT I-STAT TROPONIN I      Result Value Range   Troponin i, poc 0.00  0.00 - 0.08 ng/mL   Comment 3           POCT I-STAT TROPONIN I      Result Value Range   Troponin i, poc 0.01  0.00 - 0.08 ng/mL   Comment 3            Dg Chest 2 View  10/21/2013   CLINICAL DATA:  Chest pain.  EXAM: CHEST  2 VIEW  COMPARISON:  None.  FINDINGS: Normal heart size and mediastinal contours. No acute infiltrate or edema. No effusion or pneumothorax. No acute osseous findings.  IMPRESSION: No active cardiopulmonary disease.   Electronically Signed   By: Jorje Guild M.D.   On: 10/21/2013 06:46      EKG Interpretation   None     ED ECG REPORT  I personally interpreted this EKG  Date: 10/21/2013   Rate: 87  Rhythm: normal sinus rhythm  QRS Axis: normal  Intervals: normal  ST/T Wave abnormalities: normal  Conduction  Disutrbances:none  Narrative Interpretation:   Old EKG Reviewed: none available    MDM   1. Chest pain   2. Vomiting   3. Syncope     Patient with syncopal episode and chest pain.  I initially suspect that the burning in the chest is GI related, and that the syncopal episode is vasovagul from vomiting.  Patient does not have any cardiac risk factors.  EKG is normal.  Will check CXR and labs.  Will give fluids and GI cocktail.  Patient looks well and is not in any apparent distress. Low risk for serious outcome per [facility] Syncope Rule.  No hx of CHF, hematocrit >30%, no new EKG changes or arrhythmias, no SOB, systolic BP [id number] in triage. PERC negative, no history of PE or DVT, no recent surgery, no hemoptysis, no exogenous estrogen use, no unilateral leg swelling, no recent long travel.  Heart score is 2.    6:43 AM Patient discussed with Dr. Cheri Guppy, who agrees with the plan.  Dr. Cheri Guppy ordered a delta troponin for 0915.  If negative, will discharge to home.  9:38 AM Henri exam, patient is feeling better, abdomen is benign, will discharge to home with omeprazole. PCP followup. Labs are reassuring. Patient understands and agrees with the plan. He is stable and a for discharge.   Montine Circle, PA-C 10/21/13 1015

## 2013-10-21 NOTE — ED Notes (Signed)
B[place], PA at bedside 

## 2013-10-21 NOTE — ED Notes (Signed)
Phlebotomy at bedside.

## 2013-10-21 NOTE — Discharge Instructions (Signed)
Chest Pain (Nonspecific) °It is often hard to give a specific diagnosis for the cause of chest pain. There is always a chance that your pain could be related to something serious, such as a heart attack or a blood clot in the lungs. You need to follow up with your caregiver for further evaluation. °CAUSES  °· Heartburn. °· Pneumonia or bronchitis. °· Anxiety or stress. °· Inflammation around your heart (pericarditis) or lung (pleuritis or pleurisy). °· A blood clot in the lung. °· A collapsed lung (pneumothorax). It can develop suddenly on its own (spontaneous pneumothorax) or from injury (trauma) to the chest. °· Shingles infection (herpes zoster virus). °The chest wall is composed of bones, muscles, and cartilage. Any of these can be the source of the pain. °· The bones can be bruised by injury. °· The muscles or cartilage can be strained by coughing or overwork. °· The cartilage can be affected by inflammation and become sore (costochondritis). °DIAGNOSIS  °Lab tests or other studies, such as X-rays, electrocardiography, stress testing, or cardiac imaging, may be needed to find the cause of your pain.  °TREATMENT  °· Treatment depends on what may be causing your chest pain. Treatment may include: °· Acid blockers for heartburn. °· Anti-inflammatory medicine. °· Pain medicine for inflammatory conditions. °· Antibiotics if an infection is present. °· You may be advised to change lifestyle habits. This includes stopping smoking and avoiding alcohol, caffeine, and chocolate. °· You may be advised to keep your head raised (elevated) when sleeping. This reduces the chance of acid going backward from your stomach into your esophagus. °· Most of the time, nonspecific chest pain will improve within 2 to 3 days with rest and mild pain medicine. °HOME CARE INSTRUCTIONS  °· If antibiotics were prescribed, take your antibiotics as directed. Finish them even if you start to feel better. °· For the next few days, avoid physical  activities that bring on chest pain. Continue physical activities as directed. °· Do not smoke. °· Avoid drinking alcohol. °· Only take over-the-counter or prescription medicine for pain, discomfort, or fever as directed by your caregiver. °· Follow your caregiver's suggestions for further testing if your chest pain does not go away. °· Keep any follow-up appointments you made. If you do not go to an appointment, you could develop lasting (chronic) problems with pain. If there is any problem keeping an appointment, you must call to reschedule. °SEEK MEDICAL CARE IF:  °· You think you are having problems from the medicine you are taking. Read your medicine instructions carefully. °· Your chest pain does not go away, even after treatment. °· You develop a rash with blisters on your chest. °SEEK IMMEDIATE MEDICAL CARE IF:  °· You have increased chest pain or pain that spreads to your arm, neck, jaw, back, or abdomen. °· You develop shortness of breath, an increasing cough, or you are coughing up blood. °· You have severe back or abdominal pain, feel nauseous, or vomit. °· You develop severe weakness, fainting, or chills. °· You have a fever. °THIS IS AN EMERGENCY. Do not wait to see if the pain will go away. Get medical help at once. Call your local emergency services (911 in [place] Do not drive yourself to the hospital. °MAKE SURE YOU:  °· Understand these instructions. °· Will watch your condition. °· Will get help right away if you are not doing well or get worse. °Document Released: 06/11/2005 Document Revised: 11/24/2011 Document Reviewed: 04/06/2008 °Exit[employer]® Patient Information ©2014 [employer]   [employer]. ° °

## 2013-10-21 NOTE — ED Notes (Signed)
Patient presents to ED via [facility]. Patient states that at approx 0230 this morning he woke up with "burning chest pain" in his upper chest. Patient vomited x3, became diaphoretic and had a witnessed syncopal episode. Wife states that he was "uncounscious" approx 5 minutes. Upon EMS arrival patient was A&Ox4. Negative ortho static vitals with EMS. Unremarkable EKG. VSS. EMS gave patient 324 ASA. Currently denies any chest pain, shortness of breath or n/v. No acute distress noted at this time. A&Ox4.

## 2013-10-22 NOTE — ED Provider Notes (Signed)
Medical screening examination/treatment/procedure(s) were performed by non-physician practitioner and as supervising physician I was immediately available for consultation/collaboration.     Elyn Peers, MD 10/22/13 [phone]

## 2014-06-26 ENCOUNTER — Emergency Department (HOSPITAL_COMMUNITY): Payer: No Typology Code available for payment source

## 2014-06-26 ENCOUNTER — Encounter (HOSPITAL_COMMUNITY): Payer: Self-pay | Admitting: Emergency Medicine

## 2014-06-26 ENCOUNTER — Emergency Department (HOSPITAL_COMMUNITY)
Admission: EM | Admit: 2014-06-26 | Discharge: 2014-06-26 | Disposition: A | Discharge status: Home / Self Care | Payer: No Typology Code available for payment source | Attending: Emergency Medicine | Admitting: Emergency Medicine

## 2014-06-26 DIAGNOSIS — Y92410 Unspecified street and highway as the place of occurrence of the external cause: Secondary | ICD-10-CM | POA: Insufficient documentation

## 2014-06-26 DIAGNOSIS — Y9389 Activity, other specified: Secondary | ICD-10-CM | POA: Diagnosis not present

## 2014-06-26 DIAGNOSIS — S0093XA Contusion of unspecified part of head, initial encounter: Secondary | ICD-10-CM | POA: Diagnosis not present

## 2014-06-26 DIAGNOSIS — S300XXA Contusion of lower back and pelvis, initial encounter: Secondary | ICD-10-CM | POA: Insufficient documentation

## 2014-06-26 DIAGNOSIS — S1093XA Contusion of unspecified part of neck, initial encounter: Secondary | ICD-10-CM | POA: Diagnosis not present

## 2014-06-26 DIAGNOSIS — Z79899 Other long term (current) drug therapy: Secondary | ICD-10-CM | POA: Diagnosis not present

## 2014-06-26 DIAGNOSIS — Z880 Allergy status to penicillin: Secondary | ICD-10-CM | POA: Insufficient documentation

## 2014-06-26 DIAGNOSIS — S3982XA Other specified injuries of lower back, initial encounter: Secondary | ICD-10-CM | POA: Diagnosis present

## 2014-06-26 DIAGNOSIS — T07XXXA Unspecified multiple injuries, initial encounter: Secondary | ICD-10-CM

## 2014-06-26 MED ORDER — MORPHINE SULFATE 4 MG/ML IJ SOLN
4.0000 mg | INTRAMUSCULAR | Status: DC | PRN
  Administered 2014-06-26: 4 mg via INTRAVENOUS
  Filled 2014-06-26: qty 1

## 2014-06-26 MED ORDER — NAPROXEN 500 MG PO TABS: 500.0000 mg | ORAL_TABLET | Freq: Two times a day (BID) | ORAL | Status: AC

## 2014-06-26 MED ORDER — METHOCARBAMOL 500 MG PO TABS: 500.0000 mg | ORAL_TABLET | Freq: Two times a day (BID) | ORAL | Status: AC

## 2014-06-26 MED ORDER — HYDROCODONE-ACETAMINOPHEN 5-325 MG PO TABS: 2.0000 | ORAL_TABLET | ORAL | Status: DC | PRN

## 2014-06-26 MED ORDER — ONDANSETRON HCL 4 MG/2ML IJ SOLN
4.0000 mg | Freq: Once | INTRAMUSCULAR | Status: AC
  Administered 2014-06-26: 4 mg via INTRAVENOUS
  Filled 2014-06-26: qty 2
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
## 2014-06-26 NOTE — ED Notes (Signed)
Patient transported to X-ray 

## 2014-06-26 NOTE — ED Notes (Signed)
Patient transported to CT 

## 2014-06-26 NOTE — Discharge Instructions (Signed)
CT scan today shows enlargement of your lymph nodes in your upper chest. Is recommended that you call your primary care physician to schedule an outpatient CT scan of your chest to further evaluate these.  Contusion A contusion is a deep bruise. Contusions are the result of an injury that caused bleeding under the skin. The contusion may turn blue, purple, or yellow. Minor injuries will give you a painless contusion, but more severe contusions may stay painful and swollen for a few weeks.  CAUSES  A contusion is usually caused by a blow, trauma, or direct force to an area of the body. SYMPTOMS   Swelling and redness of the injured area.  Bruising of the injured area.  Tenderness and soreness of the injured area.  Pain. DIAGNOSIS  The diagnosis can be made by taking a history and physical exam. An X-ray, CT scan, or MRI may be needed to determine if there were any associated injuries, such as fractures. TREATMENT  Specific treatment will depend on what area of the body was injured. In general, the best treatment for a contusion is resting, icing, elevating, and applying cold compresses to the injured area. Over-the-counter medicines may also be recommended for pain control. Ask your caregiver what the best treatment is for your contusion. HOME CARE INSTRUCTIONS   Put ice on the injured area.  Put ice in a plastic bag.  Place a towel between your skin and the bag.  Leave the ice on for 15-20 minutes, 3-4 times a day, or as directed by your health care provider.  Only take over-the-counter or prescription medicines for pain, discomfort, or fever as directed by your caregiver. Your caregiver may recommend avoiding anti-inflammatory medicines (aspirin, ibuprofen, and naproxen) for 48 hours because these medicines may increase bruising.  Rest the injured area.  If possible, elevate the injured area to reduce swelling. SEEK IMMEDIATE MEDICAL CARE IF:   You have increased bruising or  swelling.  You have pain that is getting worse.  Your swelling or pain is not relieved with medicines. MAKE SURE YOU:   Understand these instructions.  Will watch your condition.  Will get help right away if you are not doing well or get worse. Document Released: 06/11/2005 Document Revised: 09/06/2013 Document Reviewed: 07/07/2011 [facility] Patient Information 2015 [place], [place]. This information is not intended to replace advice given to you by your health care provider. Make sure you discuss any questions you have with your health care provider.

## 2014-06-26 NOTE — ED Notes (Signed)
Patient is alert and orientedx4.  Patient was explained discharge instructions and they understood them with no questions.  The patient's wife, Giovanie Lefebre is taking the patient home.
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
## 2014-07-03 ENCOUNTER — Encounter (HOSPITAL_COMMUNITY): Payer: Self-pay | Admitting: Emergency Medicine

## 2014-07-03 ENCOUNTER — Emergency Department (INDEPENDENT_AMBULATORY_CARE_PROVIDER_SITE_OTHER)
Admission: EM | Admit: 2014-07-03 | Discharge: 2014-07-03 | Disposition: A | Discharge status: Home / Self Care | Payer: Self-pay | Source: Home / Self Care | Attending: Family Medicine | Admitting: Family Medicine

## 2014-07-03 DIAGNOSIS — M5489 Other dorsalgia: Secondary | ICD-10-CM

## 2014-07-03 MED ORDER — KETOROLAC TROMETHAMINE 60 MG/2ML IM SOLN
60.0000 mg | Freq: Once | INTRAMUSCULAR | Status: AC
  Administered 2014-07-03: 60 mg via INTRAMUSCULAR

## 2014-07-03 MED ORDER — METHYLPREDNISOLONE (PAK) 4 MG PO TABS: ORAL_TABLET | ORAL | Status: AC

## 2014-07-03 MED ORDER — KETOROLAC TROMETHAMINE 60 MG/2ML IM SOLN
INTRAMUSCULAR | Status: AC
  Filled 2014-07-03: qty 2

## 2014-07-03 NOTE — Discharge Instructions (Signed)
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
## 2014-07-05 NOTE — ED Provider Notes (Signed)
Medical screening examination/treatment/procedure(s) were performed by a resident physician or non-physician practitioner and as the supervising physician I was immediately available for consultation/collaboration.  Lynne Leader, MD    Gregor Hams, MD 07/05/14 [phone]

## 2014-07-06 ENCOUNTER — Encounter: Payer: Self-pay | Admitting: Family Medicine

## 2014-07-06 ENCOUNTER — Ambulatory Visit (INDEPENDENT_AMBULATORY_CARE_PROVIDER_SITE_OTHER): Payer: Self-pay | Admitting: Family Medicine

## 2014-07-06 VITALS — BP 129/80 | HR 80 | Ht 71.0 in | Wt 240.0 lb

## 2014-07-06 DIAGNOSIS — M545 Low back pain, unspecified: Secondary | ICD-10-CM

## 2014-07-06 DIAGNOSIS — S39012A Strain of muscle, fascia and tendon of lower back, initial encounter: Secondary | ICD-10-CM

## 2014-07-06 NOTE — Patient Instructions (Addendum)
You have a severe lumbar strain, possible disc herniation in low back. Both are treated similarly initially. Continue your medrol dose pack until you've finished this.   Day after finishing medrol start aleve 2 tabs twice a day with food for pain and inflammation. Norco as needed for severe pain (no driving on this medicine). Robaxin as needed for muscle spasms (no driving on this medicine if it makes you sleepy). Stay as active as possible. Physical therapy has been shown to be helpful as well - start this and do home exercises on days you don't go to therapy. If not improving, will consider further imaging (MRI). Follow up with me in 1 month to 6 weeks.

## 2014-07-11 ENCOUNTER — Encounter: Payer: Self-pay | Admitting: Family Medicine

## 2014-07-11 DIAGNOSIS — M545 Low back pain, unspecified: Secondary | ICD-10-CM | POA: Insufficient documentation
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
## 2014-07-17 ENCOUNTER — Ambulatory Visit: Payer: Self-pay | Attending: Family Medicine

## 2014-07-17 DIAGNOSIS — R293 Abnormal posture: Secondary | ICD-10-CM | POA: Insufficient documentation

## 2014-07-17 DIAGNOSIS — J45909 Unspecified asthma, uncomplicated: Secondary | ICD-10-CM | POA: Insufficient documentation

## 2014-07-17 DIAGNOSIS — Z5189 Encounter for other specified aftercare: Secondary | ICD-10-CM | POA: Insufficient documentation

## 2014-07-17 DIAGNOSIS — M545 Low back pain: Secondary | ICD-10-CM | POA: Insufficient documentation

## 2014-07-20 ENCOUNTER — Ambulatory Visit: Payer: Self-pay | Admitting: Rehabilitation

## 2014-07-26 ENCOUNTER — Ambulatory Visit: Payer: Self-pay

## 2014-07-28 ENCOUNTER — Ambulatory Visit: Payer: Self-pay | Admitting: Physical Therapy

## 2014-08-01 ENCOUNTER — Ambulatory Visit: Payer: Self-pay | Admitting: Rehabilitation

## 2014-08-03 ENCOUNTER — Ambulatory Visit: Payer: Self-pay | Admitting: Rehabilitation

## 2014-08-03 ENCOUNTER — Ambulatory Visit: Payer: BC Managed Care – PPO | Admitting: Family Medicine

## 2014-08-07 ENCOUNTER — Ambulatory Visit: Payer: Self-pay | Admitting: Rehabilitation

## 2014-08-14 ENCOUNTER — Ambulatory Visit: Payer: Self-pay | Admitting: Rehabilitation

## 2014-08-17 ENCOUNTER — Ambulatory Visit: Payer: Self-pay | Admitting: Rehabilitation

## 2014-11-01 ENCOUNTER — Encounter (HOSPITAL_COMMUNITY): Payer: Self-pay | Admitting: Family Medicine

## 2014-11-01 ENCOUNTER — Emergency Department (HOSPITAL_COMMUNITY)
Admission: EM | Admit: 2014-11-01 | Discharge: 2014-11-01 | Disposition: A | Discharge status: Home / Self Care | Payer: Self-pay | Attending: Emergency Medicine | Admitting: Emergency Medicine

## 2014-11-01 DIAGNOSIS — Z86718 Personal history of other venous thrombosis and embolism: Secondary | ICD-10-CM | POA: Insufficient documentation

## 2014-11-01 DIAGNOSIS — M76899 Other specified enthesopathies of unspecified lower limb, excluding foot: Secondary | ICD-10-CM

## 2014-11-01 DIAGNOSIS — J45909 Unspecified asthma, uncomplicated: Secondary | ICD-10-CM | POA: Insufficient documentation

## 2014-11-01 DIAGNOSIS — Z791 Long term (current) use of non-steroidal anti-inflammatories (NSAID): Secondary | ICD-10-CM | POA: Insufficient documentation

## 2014-11-01 DIAGNOSIS — Z880 Allergy status to penicillin: Secondary | ICD-10-CM | POA: Insufficient documentation

## 2014-11-01 DIAGNOSIS — M79606 Pain in leg, unspecified: Secondary | ICD-10-CM

## 2014-11-01 DIAGNOSIS — Z79899 Other long term (current) drug therapy: Secondary | ICD-10-CM | POA: Insufficient documentation

## 2014-11-01 DIAGNOSIS — M25561 Pain in right knee: Secondary | ICD-10-CM

## 2014-11-01 DIAGNOSIS — M76891 Other specified enthesopathies of right lower limb, excluding foot: Secondary | ICD-10-CM | POA: Insufficient documentation

## 2014-11-01 HISTORY — DX: Acute embolism and thrombosis of unspecified deep veins of unspecified lower extremity: I82.409

## 2014-11-01 MED ORDER — IBUPROFEN 600 MG PO TABS: 600.0000 mg | ORAL_TABLET | Freq: Four times a day (QID) | ORAL | Status: DC | PRN

## 2014-11-01 MED ORDER — OXYCODONE-ACETAMINOPHEN 5-325 MG PO TABS: 1.0000 | ORAL_TABLET | Freq: Four times a day (QID) | ORAL | Status: AC | PRN

## 2014-11-01 NOTE — Discharge Instructions (Signed)

## 2014-11-01 NOTE — ED Notes (Signed)
Pt having left knee pain and leg pain. sts hx of blood clot in that leg. sts x 3 weeks.

## 2014-11-01 NOTE — ED Notes (Signed)
NAD at this time. Pt is stable and leaving with his wife.

## 2014-11-01 NOTE — Progress Notes (Signed)
*  Preliminary Results* Left lower extremity venous duplex completed. Left lower extremity is negative for deep vein thrombosis. There is no evidence of left Baker's cyst.  11/01/2014 12:26 PM  Maudry Mayhew, RVT, RDCS, RDMS

## 2014-11-01 NOTE — ED Provider Notes (Signed)
CSN: [id number]     Arrival date & time 11/01/14  1108 History   First MD Initiated Contact with Patient 11/01/14 1303     Chief Complaint  Patient presents with  . Knee Pain     (Consider location/radiation/quality/duration/timing/severity/associated sxs/prior Treatment) Patient is a 49 y.o. male presenting with knee pain. The history is provided by the patient.  Knee Pain Location:  Knee Associated symptoms: no fever and no neck pain    patient with pain in his left knee. Worse with certain positions. No trauma. Began yesterday. Previous history of DVT but states this does not feel like that. That was several years ago and no clear cause was found at that time. No chest pain or difficulty breathing. No swelling in his leg. Patient does not smoke. No recent travel. The pain is on the back of his knee and worse on the right side. He states he also had some back pain prior but states his back does not hurt now.  Past Medical History  Diagnosis Date  . No pertinent past medical history   . Asthma     mild-mostly exercise induced  . DVT (deep venous thrombosis)    Past Surgical History  Procedure Laterality Date  . No past surgeries      never been sedated  . Mass excision  02/16/2012    Procedure: EXCISION MASS;  Surgeon: Cristine Polio, MD;  Location: [place];  Service: Plastics;  Laterality: Left;  left neck   History reviewed. No pertinent family history. History  Substance Use Topics  . Smoking status: Never Smoker   . Smokeless tobacco: Not on file  . Alcohol Use: Yes    Review of Systems  Constitutional: Negative for fever and chills.  Respiratory: Negative for shortness of breath.   Cardiovascular: Negative for chest pain.  Musculoskeletal: Negative for myalgias, joint swelling, neck pain and neck stiffness.  Skin: Negative for rash.  Hematological: Negative for adenopathy. Does not bruise/bleed easily.      Allergies  Penicillins  Home  Medications   Prior to Admission medications   Medication Sig Start Date End Date Taking? Authorizing Provider  albuterol (PROVENTIL HFA;VENTOLIN HFA) 108 (90 BASE) MCG/ACT inhaler Inhale 2 puffs into the lungs every 6 (six) hours as needed.    Historical Provider, MD  HYDROcodone-acetaminophen (NORCO/VICODIN) 5-325 MG per tablet Take 2 tablets by mouth every 4 (four) hours as needed. 06/26/14   Tanna Furry, MD  ibuprofen (ADVIL,MOTRIN) 600 MG tablet Take 1 tablet (600 mg total) by mouth every 6 (six) hours as needed. 11/01/14   Jasper Riling. Lanyla Costello, MD  methocarbamol (ROBAXIN) 500 MG tablet Take 1 tablet (500 mg total) by mouth 2 (two) times daily. 06/26/14   Tanna Furry, MD  methylPREDNIsolone (MEDROL DOSPACK) 4 MG tablet follow package directions 07/03/14   Lutricia Feil, PA  naproxen (NAPROSYN) 500 MG tablet Take 1 tablet (500 mg total) by mouth 2 (two) times daily. 06/26/14   Tanna Furry, MD  omeprazole (PRILOSEC) 20 MG capsule Take 1 capsule (20 mg total) by mouth daily. 10/21/13   Montine Circle, PA-C  oxyCODONE-acetaminophen (PERCOCET/ROXICET) 5-325 MG per tablet Take 1-2 tablets by mouth every 6 (six) hours as needed. 11/01/14   Jasper Riling. Ruhama Lehew, MD   BP 120/70 mmHg  Pulse 63  Temp(Src) 97.9 F (36.6 C) (Oral)  Resp 18  Ht 5\' 11"  (1.803 m)  Wt 228 lb (103.42 kg)  BMI 31.81 kg/m2  SpO2 99% Physical  Exam  Constitutional: He appears well-developed.  Cardiovascular: Normal rate and regular rhythm.   Pulmonary/Chest: Effort normal.  Abdominal: There is no tenderness.  Musculoskeletal: He exhibits tenderness. He exhibits no edema.  Tenderness to left knee posteriorly. It Is on the superior medial part. Range of motion intact. No swelling of the knee. No erythema. No distal edema. Tenderness right at tendon insertion. No pain with straight leg raise.  Neurological: He is alert.  Skin: Skin is warm. No rash noted. No erythema.    ED Course  Procedures (including critical care  time) Labs Review Labs Reviewed - No data to display  Imaging Review No results found.   EKG Interpretation None      MDM   Final diagnoses:  Knee pain, acute, right  Tendinitis of knee    Patient with knee pain. Likely tendinitis. Does not appear to need x-ray at this time. Previous history of DVT but does have negative ultrasound. Discussed patient has family about the next step. Doppler is good but not 100% for DVT. Will discharge home with ibuprofen and Percocet. Will follow-up with his PCP as needed.Ovid Curd R. Alvino Chapel, MD 11/01/14 1420

## 2016-05-16 ENCOUNTER — Ambulatory Visit (HOSPITAL_COMMUNITY)
Admission: EM | Admit: 2016-05-16 | Discharge: 2016-05-16 | Disposition: A | Discharge status: Home / Self Care | Payer: Self-pay | Attending: Physician Assistant | Admitting: Physician Assistant

## 2016-05-16 ENCOUNTER — Ambulatory Visit (INDEPENDENT_AMBULATORY_CARE_PROVIDER_SITE_OTHER): Payer: Self-pay

## 2016-05-16 ENCOUNTER — Encounter (HOSPITAL_COMMUNITY): Payer: Self-pay | Admitting: Family Medicine

## 2016-05-16 DIAGNOSIS — S62609A Fracture of unspecified phalanx of unspecified finger, initial encounter for closed fracture: Secondary | ICD-10-CM

## 2016-05-16 MED ORDER — HYDROCODONE-ACETAMINOPHEN 5-325 MG PO TABS: 2.0000 | ORAL_TABLET | ORAL | 0 refills | Status: AC | PRN

## 2016-05-16 MED ORDER — IBUPROFEN 600 MG PO TABS: 600.0000 mg | ORAL_TABLET | Freq: Three times a day (TID) | ORAL | 0 refills | Status: AC

## 2016-05-16 NOTE — ED Triage Notes (Signed)
Pt here for left middle finger injury. sts he smashed it at work. Finger bruised and swollen.

## 2016-05-16 NOTE — Discharge Instructions (Signed)
You have a fracture to finger, does not involve the joint. Wear your splint for the next 2 weeks for protection. Use Ibuprofen for mild pain, use Norco for severe pain as directed. Ice and elevate.

## 2016-05-16 NOTE — ED Provider Notes (Signed)
CSN: [id number]     Arrival date & time 05/16/16  1839 History   First MD Initiated Contact with Patient 05/16/16 1938     Chief Complaint  Patient presents with  . Finger Injury   (Consider location/radiation/quality/duration/timing/severity/associated sxs/prior Treatment) 50 yo male presents with left 3rd finger injury. He got it caught between machinery at work. Pain and swelling distally.       Past Medical History:  Diagnosis Date  . Asthma    mild-mostly exercise induced  . DVT (deep venous thrombosis) ([place])   . No pertinent past medical history    Past Surgical History:  Procedure Laterality Date  . MASS EXCISION  02/16/2012   Procedure: EXCISION MASS;  Surgeon: Cristine Polio, MD;  Location: [place];  Service: Plastics;  Laterality: Left;  left neck  . NO PAST SURGERIES     never been sedated   History reviewed. No pertinent family history. Social History  Substance Use Topics  . Smoking status: Never Smoker  . Smokeless tobacco: Never Used  . Alcohol use Yes    Review of Systems  Allergies  Penicillins  Home Medications   Prior to Admission medications   Medication Sig Start Date End Date Taking? Authorizing Provider  albuterol (PROVENTIL HFA;VENTOLIN HFA) 108 (90 BASE) MCG/ACT inhaler Inhale 2 puffs into the lungs every 6 (six) hours as needed.    Historical Provider, MD  HYDROcodone-acetaminophen (NORCO/VICODIN) 5-325 MG tablet Take 2 tablets by mouth every 4 (four) hours as needed. 05/16/16   Bjorn Pippin, PA-C  ibuprofen (ADVIL,MOTRIN) 600 MG tablet Take 1 tablet (600 mg total) by mouth 3 (three) times daily. 05/16/16   Bjorn Pippin, PA-C  methocarbamol (ROBAXIN) 500 MG tablet Take 1 tablet (500 mg total) by mouth 2 (two) times daily. 06/26/14   Tanna Furry, MD  methylPREDNIsolone (MEDROL DOSPACK) 4 MG tablet follow package directions 07/03/14   Lutricia Feil, PA  naproxen (NAPROSYN) 500 MG tablet Take 1 tablet (500 mg total)  by mouth 2 (two) times daily. 06/26/14   Tanna Furry, MD  omeprazole (PRILOSEC) 20 MG capsule Take 1 capsule (20 mg total) by mouth daily. 10/21/13   Montine Circle, PA-C  oxyCODONE-acetaminophen (PERCOCET/ROXICET) 5-325 MG per tablet Take 1-2 tablets by mouth every 6 (six) hours as needed. 11/01/14   Davonna Belling, MD   Meds Ordered and Administered this Visit  Medications - No data to display  BP 141/82 (BP Location: Right Arm)   Pulse (!) 58   Temp 98.2 F (36.8 C) (Oral)   Resp 16   SpO2 100%  No data found.   Physical Exam  Constitutional: He is oriented to person, place, and time. He appears well-developed and well-nourished. No distress.  Musculoskeletal: He exhibits edema and tenderness.  Left 3rd distal tuft swelling along the pad and pain with palpation. No nail involvement. Good color. Dull ROM in the DIP and PIP. Sensation intact  Neurological: He is alert and oriented to person, place, and time.  Skin: Skin is warm and dry. He is not diaphoretic.  Psychiatric: His behavior is normal.  Nursing note reviewed.   Urgent Care Course   Clinical Course    Procedures (including critical care time)  Labs Review Labs Reviewed - No data to display  Imaging Review Dg Finger Middle Left  Result Date: 05/16/2016 CLINICAL DATA:  50 y/o  M; middle finger compression injury. EXAM: LEFT MIDDLE FINGER 2+V COMPARISON:  None. FINDINGS: Comminuted nondisplaced fracture  of the third distal phalangeal tuft. IMPRESSION: Comminuted nondisplaced fracture of third distal phalangeal tuft. Electronically Signed   By: Kristine Garbe M.D.   On: 05/16/2016 19:41     Visual Acuity Review  Right Eye Distance:   Left Eye Distance:   Bilateral Distance:    Right Eye Near:   Left Eye Near:    Bilateral Near:         MDM   1. Finger fracture, left, closed, initial encounter    Discussed with Dr. Juventino Slovak. Placed in a stack splint. Padded for protection. Ice and elevate. Norco  given for severe pain, Ibuprofen for less pain. F/U with ortho.     Bjorn Pippin, PA-C 05/16/16 2035

## 2017-07-15 IMAGING — DX DG FINGER MIDDLE 2+V*L*
3 series · 3 of 3 positions shown · non-contrast
Comparison: None.

CLINICAL DATA: 50 y/o  M; middle finger compression injury.

EXAM:
LEFT MIDDLE FINGER 2+V

[finger ap]
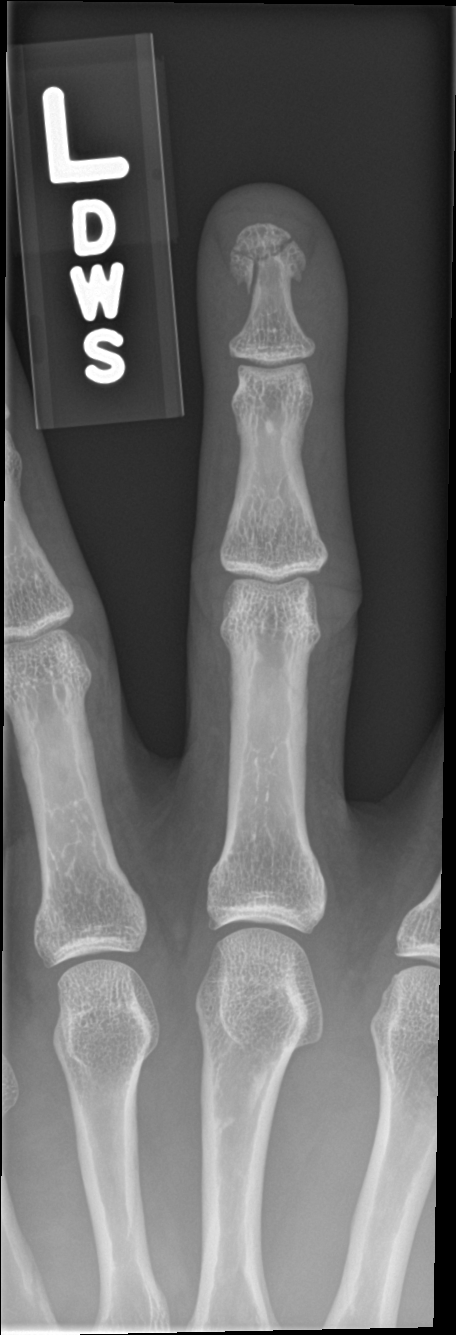

[finger obl]
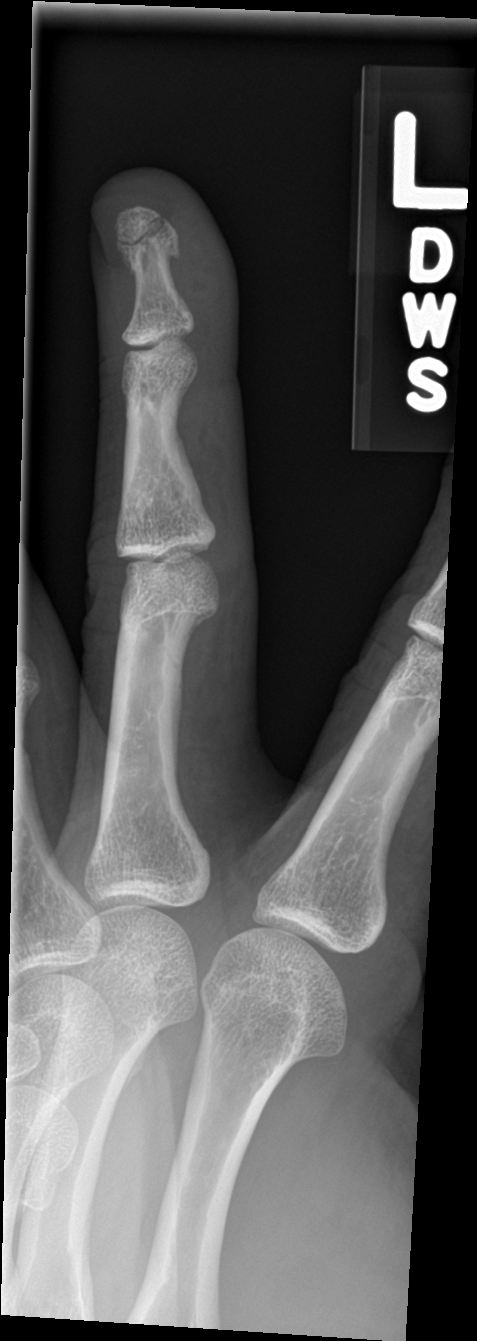

[finger lat]
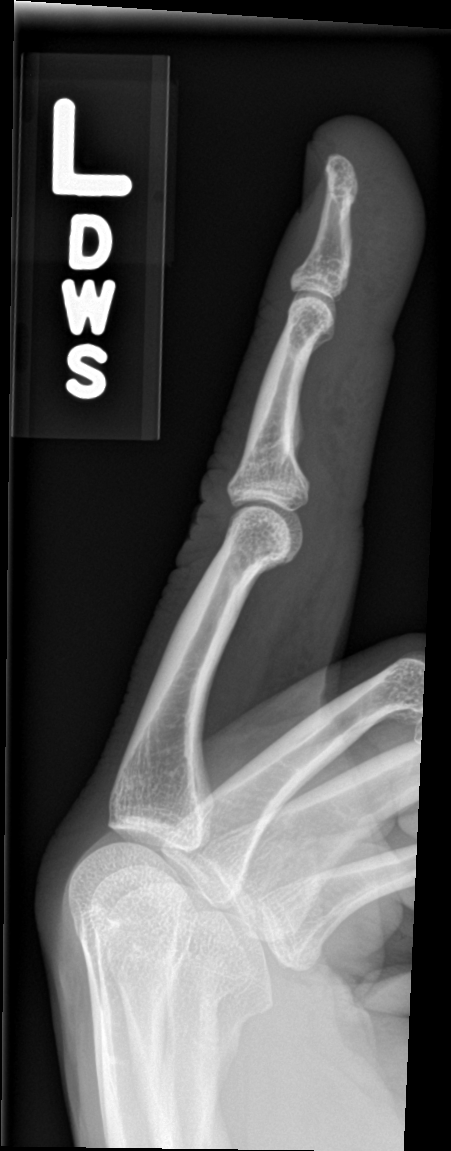

[3 of 3 positions shown; findings below may reference images not displayed]

FINDINGS: Comminuted nondisplaced fracture of the third distal phalangeal
tuft.
IMPRESSION: Comminuted nondisplaced fracture of third distal phalangeal tuft.

By: Taiwo Stein M.D.

## 2018-01-30 ENCOUNTER — Ambulatory Visit (HOSPITAL_COMMUNITY)
Admission: EM | Admit: 2018-01-30 | Discharge: 2018-01-30 | Disposition: A | Discharge status: Home / Self Care | Payer: BLUE CROSS/BLUE SHIELD | Attending: Internal Medicine | Admitting: Internal Medicine

## 2018-01-30 ENCOUNTER — Other Ambulatory Visit: Payer: Self-pay

## 2018-01-30 ENCOUNTER — Encounter (HOSPITAL_COMMUNITY): Payer: Self-pay | Admitting: Emergency Medicine

## 2018-01-30 DIAGNOSIS — R110 Nausea: Secondary | ICD-10-CM | POA: Diagnosis not present

## 2018-01-30 DIAGNOSIS — R5383 Other fatigue: Secondary | ICD-10-CM | POA: Diagnosis not present

## 2018-01-30 LAB — POCT URINALYSIS DIP (DEVICE)
Glucose, UA: NEGATIVE mg/dL
Hgb urine dipstick: NEGATIVE
Ketones, ur: 15 mg/dL — AB
Leukocytes, UA: NEGATIVE
Nitrite: NEGATIVE
Protein, ur: 30 mg/dL — AB
Specific Gravity, Urine: 1.02 (ref 1.005–1.030)
Urobilinogen, UA: 0.2 mg/dL (ref 0.0–1.0)
pH: 6 (ref 5.0–8.0)

## 2018-01-30 MED ORDER — ONDANSETRON 4 MG PO TBDP: 4.0000 mg | ORAL_TABLET | Freq: Three times a day (TID) | ORAL | 0 refills | Status: DC | PRN

## 2018-01-30 NOTE — ED Provider Notes (Addendum)
[place]    CSN: [id number] Arrival date & time: 01/30/18  1340     History   Chief Complaint Chief Complaint  Patient presents with  . Nausea    HPI Raymond Lindsey is a 52 y.o. male history of asthma and previous DVT presenting today for evaluation of nausea.  Patient states this morning around 4 AM he woke up diaphoretic as well as with nausea.  He denies any vomiting.  He had some cramping sensation in his abdomen and felt like he needed to use the restroom.  He sat on the toilet for a while and felt bilateral legs get numb.  This resolved after 10 minutes.  This is not reoccurred.  Patient states that he also felt very tired and fatigued throughout the day.  Denies any diarrhea, last bowel movement was yesterday and was normal.  Abdominal pain does wrap around to his back.  Does endorse history of kidney stones.  Denies any urinary symptoms of dysuria, increased frequency, urgency.  Denies any hematuria.  Denies any shortness of breath or chest pain.  HPI  Past Medical History:  Diagnosis Date  . Asthma    mild-mostly exercise induced  . DVT (deep venous thrombosis) ([place])   . No pertinent past medical history     Patient Active Problem List   Diagnosis Date Noted  . Low back pain 07/11/2014  . CERUMEN IMPACTION, BILATERAL 02/21/2010  . URINALYSIS, ABNORMAL 02/21/2010  . SHOULDER PAIN, RIGHT 12/13/2009    Past Surgical History:  Procedure Laterality Date  . MASS EXCISION  02/16/2012   Procedure: EXCISION MASS;  Surgeon: Cristine Polio, MD;  Location: [place];  Service: Plastics;  Laterality: Left;  left neck  . NO PAST SURGERIES     never been sedated       Home Medications    Prior to Admission medications   Medication Sig Start Date End Date Taking? Authorizing Provider  albuterol (PROVENTIL HFA;VENTOLIN HFA) 108 (90 BASE) MCG/ACT inhaler Inhale 2 puffs into the lungs every 6 (six) hours as needed.    [provider]    HYDROcodone-acetaminophen (NORCO/VICODIN) 5-325 MG tablet Take 2 tablets by mouth every 4 (four) hours as needed. 05/16/16   Bjorn Pippin, PA-C  ibuprofen (ADVIL,MOTRIN) 600 MG tablet Take 1 tablet (600 mg total) by mouth 3 (three) times daily. 05/16/16   Bjorn Pippin, PA-C  methocarbamol (ROBAXIN) 500 MG tablet Take 1 tablet (500 mg total) by mouth 2 (two) times daily. 06/26/14   Tanna Furry, MD  methylPREDNIsolone (MEDROL DOSPACK) 4 MG tablet follow package directions 07/03/14   Presson, Audelia Hives, PA  naproxen (NAPROSYN) 500 MG tablet Take 1 tablet (500 mg total) by mouth 2 (two) times daily. 06/26/14   Tanna Furry, MD  omeprazole (PRILOSEC) 20 MG capsule Take 1 capsule (20 mg total) by mouth daily. 10/21/13   Montine Circle, PA-C  ondansetron (ZOFRAN ODT) 4 MG disintegrating tablet Take 1 tablet (4 mg total) by mouth every 8 (eight) hours as needed for nausea or vomiting. 01/30/18   Wieters, Hallie C, PA-C  oxyCODONE-acetaminophen (PERCOCET/ROXICET) 5-325 MG per tablet Take 1-2 tablets by mouth every 6 (six) hours as needed. 11/01/14   Davonna Belling, MD    Family History Family History  Problem Relation Age of Onset  . Cancer Mother   . Diabetes Father     Social History Social History   Tobacco Use  . Smoking status: Never Smoker  .  Smokeless tobacco: Never Used  Substance Use Topics  . Alcohol use: Yes  . Drug use: No     Allergies   Penicillins   Review of Systems Review of Systems  Constitutional: Positive for appetite change, diaphoresis and fatigue. Negative for activity change, chills and fever.  HENT: Negative for ear pain and sore throat.   Eyes: Negative for visual disturbance.  Respiratory: Negative for cough and shortness of breath.   Cardiovascular: Negative for chest pain and palpitations.  Gastrointestinal: Positive for abdominal pain and nausea. Negative for constipation, diarrhea and vomiting.  Genitourinary: Negative for dysuria, frequency  and hematuria.  Musculoskeletal: Negative for arthralgias and back pain.  Skin: Negative for color change and rash.  Neurological: Negative for dizziness, syncope, weakness, light-headedness, numbness and headaches.  All other systems reviewed and are negative.    Physical Exam Triage Vital Signs ED Triage Vitals  Enc Vitals Group     BP 01/30/18 1530 120/77     Pulse Rate 01/30/18 1530 86     Resp 01/30/18 1530 18     Temp 01/30/18 1530 98.7 F (37.1 C)     Temp Source 01/30/18 1530 Oral     SpO2 01/30/18 1530 97 %     Weight --      Height --      Head Circumference --      Peak Flow --      Pain Score 01/30/18 1527 5     Pain Loc --      Pain Edu? --      Excl. in [place]? --    No data found.  Updated Vital Signs BP 120/77 (BP Location: Left Arm)   Pulse 86   Temp 98.7 F (37.1 C) (Oral)   Resp 18   SpO2 97%   Visual Acuity Right Eye Distance:   Left Eye Distance:   Bilateral Distance:    Right Eye Near:   Left Eye Near:    Bilateral Near:     Physical Exam  Constitutional: He is oriented to person, place, and time. He appears well-developed and well-nourished.  HENT:  Head: Normocephalic and atraumatic.  Mouth/Throat: Oropharynx is clear and moist.  Eyes: Pupils are equal, round, and reactive to light. Conjunctivae and EOM are normal.  Neck: Neck supple.  Cardiovascular: Normal rate and regular rhythm.  No murmur heard. Pulmonary/Chest: Effort normal and breath sounds normal. No respiratory distress.  Breathing comfortably at rest, CTABL, no wheezing, rales or other adventitious sounds auscultated   Abdominal: Soft. There is tenderness.  Abdomen is soft, nondistended, mild tenderness to palpation of left lower quadrant, negative rebound, negative Rovsing.  Musculoskeletal: He exhibits no edema.  Neurological: He is alert and oriented to person, place, and time.  Patient A&O x3, cranial nerves II-XII grossly intact, strength at shoulders, hips and knees  5/5, equal bilaterally, patellar reflex 2+ bilaterally. Normal Finger to nose, RAM and heel to shin. Negative Romberg and Pronator Drift. Gait without abnormality.   Skin: Skin is warm and dry.  Psychiatric: He has a normal mood and affect.  Nursing note and vitals reviewed.    UC Treatments / Results  Labs (all labs ordered are listed, but only abnormal results are displayed) Labs Reviewed  POCT URINALYSIS DIP (DEVICE) - Abnormal; Notable for the following components:      Result Value   Bilirubin Urine SMALL (*)    Ketones, ur 15 (*)    Protein, ur 30 (*)  All other components within normal limits    EKG None  Radiology No results found.  Procedures Procedures (including critical care time)  Medications Ordered in UC Medications - No data to display  Initial Impression / Assessment and Plan / UC Course  I have reviewed the triage vital signs and the nursing notes.  Pertinent labs & imaging results that were available during my care of the patient were reviewed by me and considered in my medical decision making (see chart for details).     UA without hemoglobin, does show small bilirubin, ketones and protein.  Possible dehydration.  Will recommend oral rehydration.  Zofran to use as needed for further nausea.  EKG normal sinus rhythm, no acute changes or signs of ischemia or infarction.  Neuro exam unremarkable.  Symptoms relatively resolved at time of visit.  Unclear cause of symptoms at this time.  Will have patient continue to monitor his symptoms follow-up if symptoms worsening or changing.Discussed strict return precautions. Patient verbalized understanding and is agreeable with plan.  Final Clinical Impressions(s) / UC Diagnoses   Final diagnoses:  Nausea     Discharge Instructions     Please use Zofran as needed for nausea vomiting.  EKG did not show any signs concerning for a heart attack.     ED Prescriptions    Medication Sig Dispense Auth.  Provider   ondansetron (ZOFRAN ODT) 4 MG disintegrating tablet Take 1 tablet (4 mg total) by mouth every 8 (eight) hours as needed for nausea or vomiting. 20 tablet Wieters, [place] C, PA-C     Controlled Substance Prescriptions [place] Controlled Substance Registry consulted? Not Applicable   Janith Lima, PA-C 01/31/18 [address], [place], [place] 01/31/18 1108

## 2018-01-30 NOTE — ED Triage Notes (Signed)
Woke at 4 am today sweating and nauseated, no vomiting, and lower back pain.  Back pain for 2 days.  Patient reports having abdominal pain and feeling tired.  Denies diarrhea

## 2018-01-30 NOTE — Discharge Instructions (Signed)
Please use Zofran as needed for nausea vomiting.  EKG did not show any signs concerning for a heart attack.

## 2022-01-17 ENCOUNTER — Other Ambulatory Visit: Payer: Self-pay

## 2022-01-17 ENCOUNTER — Encounter (HOSPITAL_BASED_OUTPATIENT_CLINIC_OR_DEPARTMENT_OTHER): Payer: Self-pay

## 2022-01-17 ENCOUNTER — Emergency Department (HOSPITAL_BASED_OUTPATIENT_CLINIC_OR_DEPARTMENT_OTHER): Payer: BC Managed Care – PPO | Admitting: Radiology

## 2022-01-17 DIAGNOSIS — T50905A Adverse effect of unspecified drugs, medicaments and biological substances, initial encounter: Secondary | ICD-10-CM | POA: Diagnosis not present

## 2022-01-17 DIAGNOSIS — J45909 Unspecified asthma, uncomplicated: Secondary | ICD-10-CM | POA: Diagnosis not present

## 2022-01-17 DIAGNOSIS — Z7951 Long term (current) use of inhaled steroids: Secondary | ICD-10-CM | POA: Insufficient documentation

## 2022-01-17 DIAGNOSIS — R0602 Shortness of breath: Secondary | ICD-10-CM | POA: Insufficient documentation

## 2022-01-17 DIAGNOSIS — R002 Palpitations: Secondary | ICD-10-CM | POA: Diagnosis present

## 2022-01-17 LAB — BASIC METABOLIC PANEL
Anion gap: 10 (ref 5–15)
BUN: 20 mg/dL (ref 6–20)
CO2: 25 mmol/L (ref 22–32)
Calcium: 9.5 mg/dL (ref 8.9–10.3)
Chloride: 103 mmol/L (ref 98–111)
Creatinine, Ser: 1.05 mg/dL (ref 0.61–1.24)
GFR, Estimated: >60 mL/min (ref >60)
Glucose, Bld: 104 mg/dL — ABNORMAL HIGH (ref 70–99)
Potassium: 3.9 mmol/L (ref 3.5–5.1)
Sodium: 138 mmol/L (ref 135–145)

## 2022-01-17 LAB — CBC
HCT: 44.6 % (ref 39.0–52.0)
Hemoglobin: 14 g/dL (ref 13.0–17.0)
MCH: 25.4 pg — ABNORMAL LOW (ref 26.0–34.0)
MCHC: 31.4 g/dL (ref 30.0–36.0)
MCV: 80.8 fL (ref 80.0–100.0)
Platelets: 265 10*3/uL (ref 150–400)
RBC: 5.52 MIL/uL (ref 4.22–5.81)
RDW: 12.9 % (ref 11.5–15.5)
WBC: 5.8 10*3/uL (ref 4.0–10.5)
nRBC: 0 % (ref 0.0–0.2)

## 2022-01-17 LAB — TROPONIN I (HIGH SENSITIVITY): Troponin I (High Sensitivity): 4 ng/L (ref <18)

## 2022-01-17 NOTE — ED Triage Notes (Signed)
Pt comes via [facility] for palpations and intermittent SOB that started after mariajuana use and taking sidenafil  ?

## 2022-01-18 ENCOUNTER — Emergency Department (HOSPITAL_BASED_OUTPATIENT_CLINIC_OR_DEPARTMENT_OTHER)
Admission: EM | Admit: 2022-01-18 | Discharge: 2022-01-18 | Disposition: A | Discharge status: Home / Self Care | Payer: BC Managed Care – PPO | Attending: Emergency Medicine | Admitting: Emergency Medicine

## 2022-01-18 DIAGNOSIS — R002 Palpitations: Secondary | ICD-10-CM

## 2022-01-18 DIAGNOSIS — T887XXA Unspecified adverse effect of drug or medicament, initial encounter: Secondary | ICD-10-CM

## 2022-01-18 NOTE — ED Notes (Signed)
RT spoke with pt about pulmonology appt for PFT for asthma. Pt stated he has never had a PFT performed only portable peak flow meter test. Pt respiratory status is stable w/no distress noted at this time.  ?

## 2022-01-18 NOTE — Discharge Instructions (Signed)
You were seen today for palpitations and shortness of breath.  This is likely related to a adverse reaction to medication.  Make sure that you are taking medications under the direction of Dr.  Lazaro Arms not try or use off label medications as often these medication dosages are not tightly regulated. ?

## 2022-01-18 NOTE — ED Provider Notes (Signed)
?[facility] ?Provider Note ? ? ?CSN: [id number] ?Arrival date & time: 01/17/22  2121 ? ?  ? ?History ? ?Chief Complaint  ?Patient presents with  ? Shortness of Breath  ? ? ?Jovanni Neuser is a 56 y.o. male. ? ?HPI ? ?  ? ?This is a 56 year old male who presents with palpitations and shortness of breath.  Patient reports that he took a "bluechew" for the first time tonight.  He subsequently developed racing heart and intermittent shortness of breath.  He did smoke marijuana but states he does this fairly regularly.  He has never taken a blue chew before.  Package indicates that it is sildenafil.  30 mg.  Patient reports that he got this off the Internet.  Currently, patient states that he is back to normal.  Did not have any chest pain.  No history of heart disease, diabetes, hypertension. ? ?Home Medications ?Prior to Admission medications   ?Medication Sig Start Date End Date Taking? Authorizing Provider  ?albuterol (PROVENTIL HFA;VENTOLIN HFA) 108 (90 BASE) MCG/ACT inhaler Inhale 2 puffs into the lungs every 6 (six) hours as needed.    [provider]  ?HYDROcodone-acetaminophen (NORCO/VICODIN) 5-325 MG tablet Take 2 tablets by mouth every 4 (four) hours as needed. 05/16/16   Bjorn Pippin, PA-C  ?ibuprofen (ADVIL,MOTRIN) 600 MG tablet Take 1 tablet (600 mg total) by mouth 3 (three) times daily. 05/16/16   Bjorn Pippin, PA-C  ?methocarbamol (ROBAXIN) 500 MG tablet Take 1 tablet (500 mg total) by mouth 2 (two) times daily. 06/26/14   Tanna Furry, MD  ?methylPREDNIsolone (MEDROL DOSPACK) 4 MG tablet follow package directions 07/03/14   Presson, Audelia Hives, PA  ?naproxen (NAPROSYN) 500 MG tablet Take 1 tablet (500 mg total) by mouth 2 (two) times daily. 06/26/14   Tanna Furry, MD  ?omeprazole (PRILOSEC) 20 MG capsule Take 1 capsule (20 mg total) by mouth daily. 10/21/13   Montine Circle, PA-C  ?ondansetron (ZOFRAN ODT) 4 MG disintegrating tablet Take 1 tablet (4 mg total)  by mouth every 8 (eight) hours as needed for nausea or vomiting. 01/30/18   Wieters, Hallie C, PA-C  ?oxyCODONE-acetaminophen (PERCOCET/ROXICET) 5-325 MG per tablet Take 1-2 tablets by mouth every 6 (six) hours as needed. 11/01/14   Davonna Belling, MD  ?   ? ?Allergies    ?Penicillins   ? ?Review of Systems   ?Review of Systems  ?Constitutional:  Negative for fever.  ?Respiratory:  Positive for shortness of breath.   ?Cardiovascular:  Positive for palpitations.  ?All other systems reviewed and are negative. ? ?Physical Exam ?Updated Vital Signs ?BP (!) 112/96   Pulse 71   Temp 98.1 ?F (36.7 ?C) (Oral)   Resp 18   Ht 1.778 m ('5\' 10"'$ )   Wt 103.4 kg   SpO2 100%   BMI 32.71 kg/m?  ?Physical Exam ?Vitals and nursing note reviewed.  ?Constitutional:   ?   Appearance: He is well-developed. He is not ill-appearing.  ?HENT:  ?   Head: Normocephalic and atraumatic.  ?Eyes:  ?   Pupils: Pupils are equal, round, and reactive to light.  ?Cardiovascular:  ?   Rate and Rhythm: Normal rate and regular rhythm.  ?   Heart sounds: Normal heart sounds. No murmur heard. ?Pulmonary:  ?   Effort: Pulmonary effort is normal. No respiratory distress.  ?   Breath sounds: Normal breath sounds. No wheezing.  ?Abdominal:  ?   General: Bowel sounds are normal.  ?  Palpations: Abdomen is soft.  ?   Tenderness: There is no abdominal tenderness. There is no rebound.  ?Musculoskeletal:  ?   Cervical back: Neck supple.  ?Lymphadenopathy:  ?   Cervical: No cervical adenopathy.  ?Skin: ?   General: Skin is warm and dry.  ?Neurological:  ?   Mental Status: He is alert and oriented to person, place, and time.  ?Psychiatric:     ?   Mood and Affect: Mood normal.  ? ? ?ED Results / Procedures / Treatments   ?Labs ?(all labs ordered are listed, but only abnormal results are displayed) ?Labs Reviewed  ?BASIC METABOLIC PANEL - Abnormal; Notable for the following components:  ?    Result Value  ? Glucose, Bld 104 (*)   ? All other components within  normal limits  ?CBC - Abnormal; Notable for the following components:  ? MCH 25.4 (*)   ? All other components within normal limits  ?TROPONIN I (HIGH SENSITIVITY)  ?TROPONIN I (HIGH SENSITIVITY)  ? ? ?EKG ?EKG Interpretation ? ?Date/Time:  Friday Jan 17 2022 21:30:29 EDT ?Ventricular Rate:  71 ?PR Interval:  190 ?QRS Duration: 94 ?QT Interval:  388 ?QTC Calculation: 421 ?R Axis:   62 ?Text Interpretation: Normal sinus rhythm Possible Inferior infarct , age undetermined Abnormal ECG When compared with ECG of 30-Jan-2018 14:54, Borderline criteria for Inferior infarct are now Present Confirmed by Thayer Jew [phone]) on 01/18/2022 12:13:34 AM ? ?Radiology ?DG Chest 2 View ? ?Result Date: 01/17/2022 ?CLINICAL DATA:  Chest pain. Palpitations. Intermittent shortness of breath. EXAM: CHEST - 2 VIEW COMPARISON:  06/26/2014 FINDINGS: The cardiomediastinal contours are stable since 2015 with mild hilar prominence. The lungs are clear. Pulmonary vasculature is normal. No consolidation, pleural effusion, or pneumothorax. No acute osseous abnormalities are seen. IMPRESSION: No acute chest findings or change from prior exam. Electronically Signed   By: Keith Rake M.D.   On: 01/17/2022 21:52   ? ?Procedures ?Procedures  ? ? ?Medications Ordered in ED ?Medications - No data to display ? ?ED Course/ Medical Decision Making/ A&P ?  ?                        ?Medical Decision Making ?Amount and/or Complexity of Data Reviewed ?Labs: ordered. ?Radiology: ordered. ? ? ?This patient presents to the ED for concern of palpitations and shortness of breath, this involves an extensive number of treatment options, and is a complaint that carries with it a high risk of complications and morbidity.  The differential diagnosis includes arrhythmia, adverse medication effect, ACS, pneumothorax, pneumonia ? ?MDM:   ? ?This is a 56 year old male who presents with palpitations and shortness of breath.  Onset after taking sildenafil and  marijuana.  He is currently back to normal.  Vital signs reviewed and largely reassuring.  He is afebrile.  EKG shows no evidence of ischemia or arrhythmia.  Troponin x1 negative.  He is not having any active chest pain and I doubt ACS.  Additionally, chest x-ray without pneumothorax or pneumonia.  Given patient's symptoms are completely resolved, highly suspect that this is likely an adverse reaction to the medication.  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  Had discussion with the patient regarding making sure that he only takes medications at the direction of a doctor.  Patient stated understanding.  His wife and son are at bedside and also stated understanding. ?Admission considered for cardiac monitoring ?(Labs, imaging, consults) ? ?  Labs: ?I Ordered, and personally interpreted labs.  The pertinent results include: Lab work within normal limits including CBC, CMP, troponin ? ?Imaging Studies ordered: ?I ordered imaging studies including chest x-ray ?I independently visualized and interpreted imaging. ?I agree with the radiologist interpretation ? ?Additional history obtained from wife and son.  External records from outside source obtained and reviewed including prior evaluations ? ?Cardiac Monitoring: ?The patient was maintained on a cardiac monitor.  I personally viewed and interpreted the cardiac monitored which showed an underlying rhythm of: Sinus rhythm ? ?Reevaluation: ?After the interventions noted above, I reevaluated the patient and found that they have :improved ? ?Social Determinants of Health: ?Smokes marijuana ? ?Disposition: Discharge ? ?Co morbidities that complicate the patient evaluation ? ?Past Medical History:  ?Diagnosis Date  ? Asthma   ? mild-mostly exercise induced  ? DVT (deep venous thrombosis) ([place])   ? No pertinent past medical history   ?  ? ?Medicines ?No orders of the defined types were placed in this encounter. ?  ?I have reviewed the  patients home medicines and have made adjustments as needed ? ?Problem List / ED Course: ?Problem List Items Addressed This Visit   ?None ?Visit Diagnoses   ? ? Non-dose-related adverse reaction to medication, initial encou

## 2022-12-08 ENCOUNTER — Other Ambulatory Visit: Payer: Self-pay | Admitting: Urology

## 2022-12-08 DIAGNOSIS — R972 Elevated prostate specific antigen [PSA]: Secondary | ICD-10-CM

## 2023-01-21 ENCOUNTER — Ambulatory Visit: Payer: Self-pay | Admitting: Family Medicine

## 2023-02-06 ENCOUNTER — Inpatient Hospital Stay: Admission: RE | Admit: 2023-02-06 | Payer: BLUE CROSS/BLUE SHIELD | Source: Ambulatory Visit

## 2023-03-24 ENCOUNTER — Emergency Department (HOSPITAL_BASED_OUTPATIENT_CLINIC_OR_DEPARTMENT_OTHER)
Admission: EM | Admit: 2023-03-24 | Discharge: 2023-03-24 | Disposition: A | Discharge status: Home / Self Care | Payer: BC Managed Care – PPO | Attending: Emergency Medicine | Admitting: Emergency Medicine

## 2023-03-24 ENCOUNTER — Other Ambulatory Visit (HOSPITAL_BASED_OUTPATIENT_CLINIC_OR_DEPARTMENT_OTHER): Payer: Self-pay

## 2023-03-24 ENCOUNTER — Encounter (HOSPITAL_BASED_OUTPATIENT_CLINIC_OR_DEPARTMENT_OTHER): Payer: Self-pay | Admitting: Emergency Medicine

## 2023-03-24 ENCOUNTER — Other Ambulatory Visit: Payer: Self-pay

## 2023-03-24 DIAGNOSIS — R112 Nausea with vomiting, unspecified: Secondary | ICD-10-CM | POA: Insufficient documentation

## 2023-03-24 DIAGNOSIS — J45909 Unspecified asthma, uncomplicated: Secondary | ICD-10-CM | POA: Diagnosis not present

## 2023-03-24 DIAGNOSIS — E860 Dehydration: Secondary | ICD-10-CM | POA: Diagnosis not present

## 2023-03-24 DIAGNOSIS — Z1152 Encounter for screening for COVID-19: Secondary | ICD-10-CM | POA: Insufficient documentation

## 2023-03-24 DIAGNOSIS — R531 Weakness: Secondary | ICD-10-CM | POA: Diagnosis not present

## 2023-03-24 DIAGNOSIS — R197 Diarrhea, unspecified: Secondary | ICD-10-CM | POA: Diagnosis not present

## 2023-03-24 LAB — COMPREHENSIVE METABOLIC PANEL
ALT: 23 U/L (ref 0–44)
AST: 25 U/L (ref 15–41)
Albumin: 5 g/dL (ref 3.5–5.0)
Alkaline Phosphatase: 69 U/L (ref 38–126)
Anion gap: 11 (ref 5–15)
BUN: 21 mg/dL — ABNORMAL HIGH (ref 6–20)
CO2: 24 mmol/L (ref 22–32)
Calcium: 10.8 mg/dL — ABNORMAL HIGH (ref 8.9–10.3)
Chloride: 103 mmol/L (ref 98–111)
Creatinine, Ser: 1.33 mg/dL — ABNORMAL HIGH (ref 0.61–1.24)
GFR, Estimated: >60 mL/min (ref >60)
Glucose, Bld: 126 mg/dL — ABNORMAL HIGH (ref 70–99)
Potassium: 4.5 mmol/L (ref 3.5–5.1)
Sodium: 138 mmol/L (ref 135–145)
Total Bilirubin: 0.6 mg/dL (ref 0.3–1.2)
Total Protein: 8.4 g/dL — ABNORMAL HIGH (ref 6.5–8.1)

## 2023-03-24 LAB — TROPONIN I (HIGH SENSITIVITY)
Troponin I (High Sensitivity): 2 ng/L (ref <18)
Troponin I (High Sensitivity): 3 ng/L (ref <18)

## 2023-03-24 LAB — CBC
HCT: 49.6 % (ref 39.0–52.0)
Hemoglobin: 15.4 g/dL (ref 13.0–17.0)
MCH: 25.8 pg — ABNORMAL LOW (ref 26.0–34.0)
MCHC: 31 g/dL (ref 30.0–36.0)
MCV: 82.9 fL (ref 80.0–100.0)
Platelets: 258 10*3/uL (ref 150–400)
RBC: 5.98 MIL/uL — ABNORMAL HIGH (ref 4.22–5.81)
RDW: 13.1 % (ref 11.5–15.5)
WBC: 8.7 10*3/uL (ref 4.0–10.5)
nRBC: 0 % (ref 0.0–0.2)

## 2023-03-24 LAB — SARS CORONAVIRUS 2 BY RT PCR: SARS Coronavirus 2 by RT PCR: NEGATIVE

## 2023-03-24 LAB — LIPASE, BLOOD: Lipase: 48 U/L (ref 11–51)

## 2023-03-24 MED ORDER — LOPERAMIDE HCL 2 MG PO CAPS
2.0000 mg | ORAL_CAPSULE | Freq: Four times a day (QID) | ORAL | 0 refills | Status: AC | PRN
  Filled 2023-03-24: qty 12, 3d supply, fill #0

## 2023-03-24 MED ORDER — SODIUM CHLORIDE 0.9 % IV BOLUS
1000.0000 mL | Freq: Once | INTRAVENOUS | Status: AC
  Administered 2023-03-24: 1000 mL via INTRAVENOUS

## 2023-03-24 MED ORDER — ONDANSETRON 4 MG PO TBDP
4.0000 mg | ORAL_TABLET | Freq: Three times a day (TID) | ORAL | 0 refills | Status: AC | PRN
  Filled 2023-03-24: qty 20, 7d supply, fill #0

## 2023-03-24 MED ORDER — ONDANSETRON HCL 4 MG/2ML IJ SOLN
4.0000 mg | Freq: Once | INTRAMUSCULAR | Status: AC
  Administered 2023-03-24: 4 mg via INTRAVENOUS
  Filled 2023-03-24: qty 2

## 2023-03-24 NOTE — ED Provider Notes (Signed)
Emergency Department Provider Note   I have reviewed the triage vital signs and the nursing notes.   HISTORY  Chief Complaint Weakness and Nausea   HPI Raymond Lindsey is a 57 y.o. male with PMH reviewed presents to the emergency department for evaluation of acute onset generalized weakness with profound nausea, vomiting, diarrhea.  Symptoms began while at work this morning.  This is the second such episode that he had while at work.  He works at a [employer] took his blood sugar which was normal.  No clear provoking factors.  He denies any abdominal or chest discomfort.  No severe headaches.  No fevers.  No sick contacts at home.  No URI symptoms.  He did take his zinc vitamins this morning without breakfast or other food in his stomach.  He states skipping breakfast is not particularly abnormal for him.   Past Medical History:  Diagnosis Date   Asthma    mild-mostly exercise induced   DVT (deep venous thrombosis) ([facility])    No pertinent past medical history     Review of Systems  Constitutional: No fever/chills. Positive weakness.  Cardiovascular: Denies chest pain. Respiratory: Denies shortness of breath. Gastrointestinal: No abdominal pain.  Positive nausea, vomiting, and diarrhea.  No constipation. Musculoskeletal: Negative for back pain. Skin: Negative for rash. Neurological: Negative for headaches. ____________________________________________   PHYSICAL EXAM:  VITAL SIGNS: ED Triage Vitals  Enc Vitals Group     BP 03/24/23 0804 118/71     Pulse Rate 03/24/23 0804 66     Resp 03/24/23 0804 16     Temp 03/24/23 0804 97.8 F (36.6 C)     Temp Source 03/24/23 0804 Oral     SpO2 03/24/23 0804 99 %     Weight 03/24/23 0803 247 lb 6.4 oz (112.2 kg)     Height 03/24/23 0803 5\' 10"  (1.778 m)   Constitutional: Alert and oriented. Well appearing and in no acute distress. Eyes: Conjunctivae are normal.  Head: Atraumatic. Nose: No  congestion/rhinnorhea. Mouth/Throat: Mucous membranes are moist.   Neck: No stridor.  Cardiovascular: Normal rate, regular rhythm. Good peripheral circulation. Grossly normal heart sounds.   Respiratory: Normal respiratory effort.  No retractions. Lungs CTAB. Gastrointestinal: Soft and nontender. No distention.  Musculoskeletal: No gross deformities of extremities. Neurologic:  Normal speech and language.  Skin:  Skin is warm, dry and intact. No rash noted.  ____________________________________________   LABS (all labs ordered are listed, but only abnormal results are displayed)  Labs Reviewed  COMPREHENSIVE METABOLIC PANEL - Abnormal; Notable for the following components:      Result Value   Glucose, Bld 126 (*)    BUN 21 (*)    Creatinine, Ser 1.33 (*)    Calcium 10.8 (*)    Total Protein 8.4 (*)    All other components within normal limits  CBC - Abnormal; Notable for the following components:   RBC 5.98 (*)    MCH 25.8 (*)    All other components within normal limits  SARS CORONAVIRUS 2 BY RT PCR  LIPASE, BLOOD  TROPONIN I (HIGH SENSITIVITY)  TROPONIN I (HIGH SENSITIVITY)   ____________________________________________  EKG   EKG Interpretation Date/Time:  Tuesday March 24 2023 08:05:53 EDT Ventricular Rate:  72 PR Interval:  197 QRS Duration:  100 QT Interval:  391 QTC Calculation: 428 R Axis:   87  Text Interpretation: Sinus rhythm Minimal ST elevation, inferior leads Confirmed by Alona Bene [phone]) on 03/24/2023  8:08:11 AM       ____________________________________________   PROCEDURES  Procedure(s) performed:   Procedures  None  ____________________________________________   INITIAL IMPRESSION / ASSESSMENT AND PLAN / ED COURSE  Pertinent labs & imaging results that were available during my care of the patient were reviewed by me and considered in my medical decision making (see chart for details).   This patient is Presenting for Evaluation of  weakness, which does require a range of treatment options, and is a complaint that involves a high risk of morbidity and mortality.  The Differential Diagnoses include atypical ACS, GI illness, gastritis, enteritis, allergic reaction, AKI, renal failure, etc.  Critical Interventions-    Medications  sodium chloride 0.9 % bolus 1,000 mL (0 mLs Intravenous Stopped 03/24/23 0930)  ondansetron (ZOFRAN) injection 4 mg (4 mg Intravenous Given 03/24/23 1610)    Reassessment after intervention: symptoms improved.    I did obtain Additional Historical Information from wife at bedside.   Clinical Laboratory Tests Ordered, included COVID-negative.  Troponin negative.  Creatinine mildly elevated at 1.33.  No electrolyte disturbance with normal potassium and sodium.  No anemia or leukocytosis. Lipase normal. LFTs normal.   Radiologic Tests: Considered CT abdomen/pelvis but abdomen is soft on exam. Vitals are WNL. Plan for labs and reassess. Defer imaging for now.   Cardiac Monitor Tracing which shows NSR. No ectopy.    Social Determinants of Health Risk patient is a non-smoker.   Medical Decision Making: Summary:  Patient presents emergency department with acute onset nausea, vomiting, diarrhea along with weakness.  Nonspecific ST changes on EKG.  Have added troponins.  Plan for IV fluids and antiemetics.  Abdomen is diffusely soft and nontender.  Reevaluation with update and discussion with patient and wife. Symptoms continue to improve in the ED. Discussed reassuring labs. Waiting on second troponin and PO challenge.   Second troponin negative. Patient tolerating PO. Plan for work note and meds for symptom mgmt at home. Discussed ED return precautions.   Considered admission but labs and clinical presentation are reassuring. Stable for outpatient mgmt.   Patient's presentation is most consistent with acute presentation with potential threat to life or bodily function.   Disposition:  discharge  ____________________________________________  FINAL CLINICAL IMPRESSION(S) / ED DIAGNOSES  Final diagnoses:  Nausea vomiting and diarrhea  Generalized weakness  Dehydration     NEW OUTPATIENT MEDICATIONS STARTED DURING THIS VISIT:  New Prescriptions   LOPERAMIDE (IMODIUM) 2 MG CAPSULE    Take 1 capsule (2 mg total) by mouth 4 (four) times daily as needed for diarrhea or loose stools.   ONDANSETRON (ZOFRAN-ODT) 4 MG DISINTEGRATING TABLET    Take 1 tablet (4 mg total) by mouth every 8 (eight) hours as needed.    Note:  This document was prepared using Dragon voice recognition software and may include unintentional dictation errors.  Alona Bene, MD, [facility]    Shaddai Shapley, Arlyss Repress, MD 03/24/23 [phone]

## 2023-03-24 NOTE — ED Notes (Signed)
Pt given discharge instructions and reviewed prescriptions. Opportunities given for questions. Pt verbalizes understanding. PIV removed x1. Kamarius Buckbee R, RN 

## 2023-03-24 NOTE — Discharge Instructions (Signed)

## 2023-03-24 NOTE — ED Triage Notes (Signed)
Pt arrives to ED with c/o sudden onset of nausea, weakness, and fatigue this morning. Pt notes x1 episode of emesis and denies pain.

## 2023-04-06 ENCOUNTER — Other Ambulatory Visit (HOSPITAL_BASED_OUTPATIENT_CLINIC_OR_DEPARTMENT_OTHER): Payer: Self-pay

## 2023-04-10 ENCOUNTER — Inpatient Hospital Stay: Admission: RE | Admit: 2023-04-10 | Payer: BLUE CROSS/BLUE SHIELD | Source: Ambulatory Visit

## 2023-06-02 ENCOUNTER — Other Ambulatory Visit (HOSPITAL_BASED_OUTPATIENT_CLINIC_OR_DEPARTMENT_OTHER): Payer: Self-pay

## 2023-06-02 MED ORDER — ZEPBOUND 2.5 MG/0.5ML ~~LOC~~ SOAJ
2.5000 mg | SUBCUTANEOUS | 0 refills | Status: AC
  Filled 2023-06-02: qty 2, 28d supply, fill #0

## 2023-06-03 ENCOUNTER — Other Ambulatory Visit (HOSPITAL_BASED_OUTPATIENT_CLINIC_OR_DEPARTMENT_OTHER): Payer: Self-pay

## 2023-07-06 ENCOUNTER — Other Ambulatory Visit (HOSPITAL_BASED_OUTPATIENT_CLINIC_OR_DEPARTMENT_OTHER): Payer: Self-pay

## 2023-07-06 MED ORDER — ZEPBOUND 5 MG/0.5ML ~~LOC~~ SOAJ
5.0000 mg | SUBCUTANEOUS | 0 refills | Status: DC
  Filled 2023-07-06: qty 2, 28d supply, fill #0

## 2023-08-03 ENCOUNTER — Other Ambulatory Visit (HOSPITAL_BASED_OUTPATIENT_CLINIC_OR_DEPARTMENT_OTHER): Payer: Self-pay

## 2023-08-03 MED ORDER — ZEPBOUND 7.5 MG/0.5ML ~~LOC~~ SOAJ
7.5000 mg | SUBCUTANEOUS | 0 refills | Status: DC
  Filled 2023-08-03 – 2023-08-04 (×2): qty 2, 28d supply, fill #0

## 2023-08-04 ENCOUNTER — Other Ambulatory Visit (HOSPITAL_BASED_OUTPATIENT_CLINIC_OR_DEPARTMENT_OTHER): Payer: Self-pay

## 2023-08-31 ENCOUNTER — Other Ambulatory Visit (HOSPITAL_BASED_OUTPATIENT_CLINIC_OR_DEPARTMENT_OTHER): Payer: Self-pay

## 2023-08-31 MED ORDER — ZEPBOUND 10 MG/0.5ML ~~LOC~~ SOAJ
10.0000 mg | SUBCUTANEOUS | 0 refills | Status: DC
  Filled 2023-08-31: qty 2, 28d supply, fill #0

## 2023-09-29 ENCOUNTER — Other Ambulatory Visit (HOSPITAL_BASED_OUTPATIENT_CLINIC_OR_DEPARTMENT_OTHER): Payer: Self-pay

## 2023-09-29 MED ORDER — ZEPBOUND 10 MG/0.5ML ~~LOC~~ SOAJ
10.0000 mg | SUBCUTANEOUS | 1 refills | Status: DC
  Filled 2023-09-29 – 2023-10-19 (×2): qty 2, 28d supply, fill #0

## 2023-10-19 ENCOUNTER — Other Ambulatory Visit (HOSPITAL_BASED_OUTPATIENT_CLINIC_OR_DEPARTMENT_OTHER): Payer: Self-pay

## 2023-10-26 ENCOUNTER — Other Ambulatory Visit (HOSPITAL_BASED_OUTPATIENT_CLINIC_OR_DEPARTMENT_OTHER): Payer: Self-pay

## 2023-10-26 MED ORDER — ZEPBOUND 12.5 MG/0.5ML ~~LOC~~ SOAJ
12.5000 mg | SUBCUTANEOUS | 0 refills | Status: AC
  Filled 2023-10-26 – 2023-11-09 (×3): qty 2, 28d supply, fill #0

## 2023-11-09 ENCOUNTER — Other Ambulatory Visit (HOSPITAL_BASED_OUTPATIENT_CLINIC_OR_DEPARTMENT_OTHER): Payer: Self-pay

## 2023-12-03 ENCOUNTER — Other Ambulatory Visit (HOSPITAL_BASED_OUTPATIENT_CLINIC_OR_DEPARTMENT_OTHER): Payer: Self-pay

## 2023-12-03 MED ORDER — ZEPBOUND 15 MG/0.5ML ~~LOC~~ SOAJ
15.0000 mg | SUBCUTANEOUS | 0 refills | Status: DC
  Filled 2023-12-03: qty 2, 28d supply, fill #0

## 2023-12-28 ENCOUNTER — Other Ambulatory Visit (HOSPITAL_BASED_OUTPATIENT_CLINIC_OR_DEPARTMENT_OTHER): Payer: Self-pay

## 2023-12-28 MED ORDER — ZEPBOUND 15 MG/0.5ML ~~LOC~~ SOAJ
15.0000 mg | SUBCUTANEOUS | 1 refills | Status: DC
  Filled 2023-12-28: qty 2, 28d supply, fill #0
  Filled 2024-01-26: qty 2, 28d supply, fill #1

## 2024-01-26 ENCOUNTER — Other Ambulatory Visit (HOSPITAL_BASED_OUTPATIENT_CLINIC_OR_DEPARTMENT_OTHER): Payer: Self-pay

## 2024-02-29 ENCOUNTER — Other Ambulatory Visit (HOSPITAL_BASED_OUTPATIENT_CLINIC_OR_DEPARTMENT_OTHER): Payer: Self-pay

## 2024-02-29 MED ORDER — ZEPBOUND 15 MG/0.5ML ~~LOC~~ SOAJ
15.0000 mg | SUBCUTANEOUS | 1 refills | Status: AC
  Filled 2024-02-29: qty 2, 28d supply, fill #0

## 2024-03-17 ENCOUNTER — Other Ambulatory Visit (HOSPITAL_BASED_OUTPATIENT_CLINIC_OR_DEPARTMENT_OTHER): Payer: Self-pay

## 2024-03-17 MED ORDER — ZEPBOUND 15 MG/0.5ML ~~LOC~~ SOAJ
15.0000 mg | SUBCUTANEOUS | 1 refills | Status: AC
  Filled 2024-03-17 – 2024-03-21 (×2): qty 2, 28d supply, fill #0

## 2024-03-21 ENCOUNTER — Other Ambulatory Visit: Payer: Self-pay

## 2024-03-21 ENCOUNTER — Other Ambulatory Visit (HOSPITAL_BASED_OUTPATIENT_CLINIC_OR_DEPARTMENT_OTHER): Payer: Self-pay

## 2024-03-22 ENCOUNTER — Other Ambulatory Visit (HOSPITAL_BASED_OUTPATIENT_CLINIC_OR_DEPARTMENT_OTHER): Payer: Self-pay

## 2024-04-28 ENCOUNTER — Other Ambulatory Visit (HOSPITAL_BASED_OUTPATIENT_CLINIC_OR_DEPARTMENT_OTHER): Payer: Self-pay

## 2024-04-28 MED ORDER — ZEPBOUND 15 MG/0.5ML ~~LOC~~ SOAJ
15.0000 mg | SUBCUTANEOUS | 1 refills | Status: AC
  Filled 2024-04-28: qty 2, 28d supply, fill #0

## 2024-04-29 ENCOUNTER — Other Ambulatory Visit (HOSPITAL_BASED_OUTPATIENT_CLINIC_OR_DEPARTMENT_OTHER): Payer: Self-pay

## 2024-05-02 ENCOUNTER — Other Ambulatory Visit (HOSPITAL_BASED_OUTPATIENT_CLINIC_OR_DEPARTMENT_OTHER): Payer: Self-pay

## 2024-06-02 ENCOUNTER — Other Ambulatory Visit: Payer: Self-pay

## 2024-06-02 ENCOUNTER — Other Ambulatory Visit (HOSPITAL_BASED_OUTPATIENT_CLINIC_OR_DEPARTMENT_OTHER): Payer: Self-pay

## 2024-06-02 MED ORDER — ZEPBOUND 15 MG/0.5ML ~~LOC~~ SOAJ
15.0000 mg | SUBCUTANEOUS | 1 refills | Status: AC
  Filled 2024-06-02: qty 2, 28d supply, fill #0

## 2024-06-30 ENCOUNTER — Other Ambulatory Visit (HOSPITAL_BASED_OUTPATIENT_CLINIC_OR_DEPARTMENT_OTHER): Payer: Self-pay

## 2024-06-30 MED ORDER — ZEPBOUND 15 MG/0.5ML ~~LOC~~ SOAJ
15.0000 mg | SUBCUTANEOUS | 1 refills | Status: AC
  Filled 2024-06-30: qty 2, 28d supply, fill #0

## 2024-07-01 ENCOUNTER — Other Ambulatory Visit (HOSPITAL_BASED_OUTPATIENT_CLINIC_OR_DEPARTMENT_OTHER): Payer: Self-pay

## 2024-07-28 ENCOUNTER — Other Ambulatory Visit (HOSPITAL_BASED_OUTPATIENT_CLINIC_OR_DEPARTMENT_OTHER): Payer: Self-pay

## 2024-07-28 MED ORDER — ZEPBOUND 15 MG/0.5ML ~~LOC~~ SOAJ
15.0000 mg | SUBCUTANEOUS | 1 refills | Status: AC
  Filled 2024-07-28: qty 2, 28d supply, fill #0

## 2024-08-12 ENCOUNTER — Other Ambulatory Visit (HOSPITAL_BASED_OUTPATIENT_CLINIC_OR_DEPARTMENT_OTHER): Payer: Self-pay

## 2024-08-12 MED ORDER — PREDNISONE 10 MG PO TABS
ORAL_TABLET | ORAL | 0 refills | Status: AC
  Filled 2024-08-12: qty 21, 6d supply, fill #0

## 2024-08-17 ENCOUNTER — Other Ambulatory Visit (HOSPITAL_BASED_OUTPATIENT_CLINIC_OR_DEPARTMENT_OTHER): Payer: Self-pay

## 2024-08-17 MED ORDER — METHOCARBAMOL 750 MG PO TABS
750.0000 mg | ORAL_TABLET | Freq: Two times a day (BID) | ORAL | 0 refills | Status: AC | PRN
  Filled 2024-08-17: qty 60, 30d supply, fill #0

## 2024-09-07 ENCOUNTER — Other Ambulatory Visit (HOSPITAL_BASED_OUTPATIENT_CLINIC_OR_DEPARTMENT_OTHER): Payer: Self-pay

## 2024-09-07 MED ORDER — ZEPBOUND 15 MG/0.5ML ~~LOC~~ SOAJ
15.0000 mg | SUBCUTANEOUS | 1 refills | Status: AC
  Filled 2024-09-07: qty 2, 28d supply, fill #0

## 2024-10-12 ENCOUNTER — Other Ambulatory Visit: Payer: Self-pay

## 2024-10-12 ENCOUNTER — Other Ambulatory Visit (HOSPITAL_BASED_OUTPATIENT_CLINIC_OR_DEPARTMENT_OTHER): Payer: Self-pay

## 2024-10-12 MED ORDER — ZEPBOUND 15 MG/0.5ML ~~LOC~~ SOAJ
15.0000 mg | SUBCUTANEOUS | 1 refills | Status: AC
  Filled 2024-10-12: qty 2, 28d supply, fill #0

## 2024-10-14 ENCOUNTER — Other Ambulatory Visit (HOSPITAL_BASED_OUTPATIENT_CLINIC_OR_DEPARTMENT_OTHER): Payer: Self-pay
